# Patient Record
Sex: Female | Born: 1965 | Race: White | Hispanic: No | Smoking: Current every day smoker
Health system: Southern US, Community
[De-identification: ages and names within clinical notes are randomized; demographics above are authoritative.]

## PROBLEM LIST (undated history)

## (undated) DIAGNOSIS — C569 Malignant neoplasm of unspecified ovary: Secondary | ICD-10-CM

## (undated) DIAGNOSIS — F909 Attention-deficit hyperactivity disorder, unspecified type: Secondary | ICD-10-CM

## (undated) DIAGNOSIS — H2012 Chronic iridocyclitis, left eye: Secondary | ICD-10-CM

## (undated) DIAGNOSIS — R768 Other specified abnormal immunological findings in serum: Secondary | ICD-10-CM

## (undated) DIAGNOSIS — F411 Generalized anxiety disorder: Secondary | ICD-10-CM

## (undated) DIAGNOSIS — D071 Carcinoma in situ of vulva: Secondary | ICD-10-CM

## (undated) DIAGNOSIS — K219 Gastro-esophageal reflux disease without esophagitis: Secondary | ICD-10-CM

## (undated) DIAGNOSIS — K118 Other diseases of salivary glands: Secondary | ICD-10-CM

## (undated) DIAGNOSIS — F3289 Other specified depressive episodes: Secondary | ICD-10-CM

## (undated) DIAGNOSIS — E039 Hypothyroidism, unspecified: Secondary | ICD-10-CM

## (undated) DIAGNOSIS — F1911 Other psychoactive substance abuse, in remission: Secondary | ICD-10-CM

## (undated) DIAGNOSIS — F329 Major depressive disorder, single episode, unspecified: Secondary | ICD-10-CM

## (undated) DIAGNOSIS — I10 Essential (primary) hypertension: Secondary | ICD-10-CM

## (undated) HISTORY — DX: Morbid (severe) obesity due to excess calories: E66.01

## (undated) HISTORY — DX: Major depressive disorder, single episode, unspecified: F32.9

## (undated) HISTORY — DX: Malignant neoplasm of unspecified ovary: C56.9

## (undated) HISTORY — DX: Other specified depressive episodes: F32.89

## (undated) HISTORY — DX: Other specified abnormal immunological findings in serum: R76.8

## (undated) HISTORY — DX: Essential (primary) hypertension: I10

## (undated) HISTORY — DX: Generalized anxiety disorder: F41.1

## (undated) HISTORY — DX: Attention-deficit hyperactivity disorder, unspecified type: F90.9

## (undated) HISTORY — PX: OTHER SURGICAL HISTORY: SHX169

---

## 1983-04-07 HISTORY — PX: TONSILLECTOMY AND ADENOIDECTOMY: SUR1326

## 1983-06-05 HISTORY — PX: DILATION AND CURETTAGE OF UTERUS: SHX78

## 1985-04-06 HISTORY — PX: CERVICAL POLYPECTOMY: SHX88

## 1996-04-06 HISTORY — PX: ECTOPIC PREGNANCY SURGERY: SHX613

## 2001-01-05 ENCOUNTER — Encounter: Admission: RE | Admit: 2001-01-05 | Discharge: 2001-01-05 | Payer: Self-pay | Admitting: Surgery

## 2001-01-05 ENCOUNTER — Encounter: Payer: Self-pay | Admitting: Surgery

## 2001-02-15 ENCOUNTER — Encounter: Payer: Self-pay | Admitting: Emergency Medicine

## 2001-02-15 ENCOUNTER — Emergency Department (HOSPITAL_COMMUNITY): Admission: EM | Admit: 2001-02-15 | Discharge: 2001-02-15 | Payer: Self-pay | Admitting: Emergency Medicine

## 2003-04-07 HISTORY — PX: CHOLECYSTECTOMY: SHX55

## 2003-04-25 ENCOUNTER — Inpatient Hospital Stay (HOSPITAL_COMMUNITY): Admission: EM | Admit: 2003-04-25 | Discharge: 2003-04-27 | Payer: Self-pay | Admitting: Emergency Medicine

## 2003-04-25 ENCOUNTER — Encounter (INDEPENDENT_AMBULATORY_CARE_PROVIDER_SITE_OTHER): Payer: Self-pay | Admitting: Specialist

## 2003-12-31 ENCOUNTER — Ambulatory Visit: Payer: Self-pay | Admitting: *Deleted

## 2004-02-07 ENCOUNTER — Ambulatory Visit: Payer: Self-pay | Admitting: Internal Medicine

## 2004-03-27 ENCOUNTER — Ambulatory Visit: Payer: Self-pay | Admitting: Internal Medicine

## 2005-03-20 ENCOUNTER — Ambulatory Visit: Payer: Self-pay | Admitting: Internal Medicine

## 2005-05-05 ENCOUNTER — Ambulatory Visit: Payer: Self-pay | Admitting: Family Medicine

## 2005-05-13 ENCOUNTER — Ambulatory Visit (HOSPITAL_COMMUNITY): Admission: RE | Admit: 2005-05-13 | Discharge: 2005-05-13 | Payer: Self-pay | Admitting: Internal Medicine

## 2005-05-13 ENCOUNTER — Ambulatory Visit: Payer: Self-pay | Admitting: Internal Medicine

## 2005-06-11 ENCOUNTER — Ambulatory Visit: Payer: Self-pay | Admitting: Internal Medicine

## 2005-06-19 ENCOUNTER — Ambulatory Visit: Payer: Self-pay | Admitting: Internal Medicine

## 2005-07-10 ENCOUNTER — Ambulatory Visit: Payer: Self-pay | Admitting: Internal Medicine

## 2005-09-11 ENCOUNTER — Ambulatory Visit: Payer: Self-pay | Admitting: Internal Medicine

## 2005-09-17 ENCOUNTER — Ambulatory Visit: Payer: Self-pay | Admitting: *Deleted

## 2006-01-04 ENCOUNTER — Ambulatory Visit: Payer: Self-pay | Admitting: Internal Medicine

## 2006-01-06 ENCOUNTER — Ambulatory Visit: Payer: Self-pay | Admitting: Internal Medicine

## 2006-02-09 ENCOUNTER — Ambulatory Visit: Payer: Self-pay | Admitting: Internal Medicine

## 2006-02-10 ENCOUNTER — Ambulatory Visit (HOSPITAL_COMMUNITY): Admission: RE | Admit: 2006-02-10 | Discharge: 2006-02-10 | Payer: Self-pay | Admitting: Internal Medicine

## 2006-06-02 ENCOUNTER — Ambulatory Visit: Payer: Self-pay | Admitting: Internal Medicine

## 2006-07-12 ENCOUNTER — Ambulatory Visit: Payer: Self-pay | Admitting: Internal Medicine

## 2006-08-25 ENCOUNTER — Ambulatory Visit: Payer: Self-pay | Admitting: Internal Medicine

## 2006-09-16 ENCOUNTER — Ambulatory Visit: Payer: Self-pay | Admitting: Internal Medicine

## 2006-09-22 ENCOUNTER — Ambulatory Visit: Payer: Self-pay | Admitting: Internal Medicine

## 2006-11-11 DIAGNOSIS — E039 Hypothyroidism, unspecified: Secondary | ICD-10-CM | POA: Insufficient documentation

## 2006-11-11 DIAGNOSIS — J309 Allergic rhinitis, unspecified: Secondary | ICD-10-CM | POA: Insufficient documentation

## 2006-11-11 DIAGNOSIS — I10 Essential (primary) hypertension: Secondary | ICD-10-CM | POA: Insufficient documentation

## 2006-12-22 ENCOUNTER — Encounter (INDEPENDENT_AMBULATORY_CARE_PROVIDER_SITE_OTHER): Payer: Self-pay | Admitting: *Deleted

## 2007-04-07 HISTORY — PX: BILATERAL OOPHORECTOMY: SHX1221

## 2007-04-07 HISTORY — PX: ABDOMINAL HYSTERECTOMY: SHX81

## 2007-05-31 ENCOUNTER — Ambulatory Visit: Payer: Self-pay | Admitting: Nurse Practitioner

## 2007-05-31 ENCOUNTER — Other Ambulatory Visit: Admission: RE | Admit: 2007-05-31 | Discharge: 2007-05-31 | Payer: Self-pay | Admitting: Family Medicine

## 2007-05-31 DIAGNOSIS — B1710 Acute hepatitis C without hepatic coma: Secondary | ICD-10-CM | POA: Insufficient documentation

## 2007-05-31 DIAGNOSIS — A630 Anogenital (venereal) warts: Secondary | ICD-10-CM

## 2007-05-31 DIAGNOSIS — F329 Major depressive disorder, single episode, unspecified: Secondary | ICD-10-CM

## 2007-05-31 DIAGNOSIS — M549 Dorsalgia, unspecified: Secondary | ICD-10-CM | POA: Insufficient documentation

## 2007-05-31 DIAGNOSIS — B977 Papillomavirus as the cause of diseases classified elsewhere: Secondary | ICD-10-CM

## 2007-06-01 ENCOUNTER — Encounter (INDEPENDENT_AMBULATORY_CARE_PROVIDER_SITE_OTHER): Payer: Self-pay | Admitting: Nurse Practitioner

## 2007-06-01 LAB — CONVERTED CEMR LAB
AST: 17 units/L (ref 0–37)
Albumin: 3.7 g/dL (ref 3.5–5.2)
Alkaline Phosphatase: 160 units/L — ABNORMAL HIGH (ref 39–117)
BUN: 6 mg/dL (ref 6–23)
CO2: 27 meq/L (ref 19–32)
Creatinine, Ser: 0.76 mg/dL (ref 0.40–1.20)
Eosinophils Absolute: 0.1 10*3/uL (ref 0.0–0.7)
Eosinophils Relative: 1 % (ref 0–5)
Glucose, Bld: 111 mg/dL — ABNORMAL HIGH (ref 70–99)
HCT: 41.9 % (ref 36.0–46.0)
HDL: 20 mg/dL — ABNORMAL LOW (ref >39)
LDL Cholesterol: 96 mg/dL (ref 0–99)
Lymphocytes Relative: 16 % (ref 12–46)
Lymphs Abs: 2 10*3/uL (ref 0.7–4.0)
MCV: 92.5 fL (ref 78.0–100.0)
Monocytes Relative: 7 % (ref 3–12)
Sodium: 139 meq/L (ref 135–145)
TSH: 0.014 microintl units/mL — ABNORMAL LOW (ref 0.350–5.50)
Total CHOL/HDL Ratio: 7.4
WBC: 12.6 10*3/uL — ABNORMAL HIGH (ref 4.0–10.5)

## 2007-06-02 ENCOUNTER — Telehealth (INDEPENDENT_AMBULATORY_CARE_PROVIDER_SITE_OTHER): Payer: Self-pay | Admitting: Nurse Practitioner

## 2007-06-15 ENCOUNTER — Ambulatory Visit: Payer: Self-pay | Admitting: Nurse Practitioner

## 2007-06-16 ENCOUNTER — Encounter (INDEPENDENT_AMBULATORY_CARE_PROVIDER_SITE_OTHER): Payer: Self-pay | Admitting: Nurse Practitioner

## 2007-06-16 LAB — CONVERTED CEMR LAB
HCV Quantitative: 91200 intl units/mL — ABNORMAL HIGH (ref <43)
Hep A Total Ab: NEGATIVE
Hep B Core Total Ab: NEGATIVE
Hep B S Ab: NEGATIVE
Progesterone: 2.5 ng/mL
Prolactin: 16.4 ng/mL
Testosterone: 196.57 ng/dL — ABNORMAL HIGH (ref 10–70)

## 2007-06-28 ENCOUNTER — Ambulatory Visit (HOSPITAL_COMMUNITY): Admission: RE | Admit: 2007-06-28 | Discharge: 2007-06-28 | Payer: Self-pay | Admitting: Internal Medicine

## 2007-06-29 ENCOUNTER — Ambulatory Visit: Payer: Self-pay | Admitting: Nurse Practitioner

## 2007-06-29 LAB — CONVERTED CEMR LAB: Blood Glucose, Fingerstick: 123

## 2007-07-01 ENCOUNTER — Ambulatory Visit (HOSPITAL_COMMUNITY): Admission: RE | Admit: 2007-07-01 | Discharge: 2007-07-01 | Payer: Self-pay | Admitting: Family Medicine

## 2007-07-01 DIAGNOSIS — K439 Ventral hernia without obstruction or gangrene: Secondary | ICD-10-CM | POA: Insufficient documentation

## 2007-07-04 ENCOUNTER — Telehealth (INDEPENDENT_AMBULATORY_CARE_PROVIDER_SITE_OTHER): Payer: Self-pay | Admitting: Nurse Practitioner

## 2007-07-08 ENCOUNTER — Ambulatory Visit: Payer: Self-pay | Admitting: Nurse Practitioner

## 2007-07-12 ENCOUNTER — Telehealth (INDEPENDENT_AMBULATORY_CARE_PROVIDER_SITE_OTHER): Payer: Self-pay | Admitting: Nurse Practitioner

## 2007-07-12 LAB — CONVERTED CEMR LAB: Free T4: 1.7 ng/dL (ref 0.89–1.80)

## 2007-07-14 ENCOUNTER — Emergency Department (HOSPITAL_COMMUNITY): Admission: EM | Admit: 2007-07-14 | Discharge: 2007-07-14 | Payer: Self-pay | Admitting: Emergency Medicine

## 2007-07-18 ENCOUNTER — Inpatient Hospital Stay (HOSPITAL_COMMUNITY): Admission: AD | Admit: 2007-07-18 | Discharge: 2007-07-18 | Payer: Self-pay | Admitting: Obstetrics & Gynecology

## 2007-07-18 ENCOUNTER — Telehealth (INDEPENDENT_AMBULATORY_CARE_PROVIDER_SITE_OTHER): Payer: Self-pay | Admitting: Nurse Practitioner

## 2007-07-20 ENCOUNTER — Ambulatory Visit: Payer: Self-pay | Admitting: Gynecology

## 2007-07-20 ENCOUNTER — Other Ambulatory Visit: Admission: RE | Admit: 2007-07-20 | Discharge: 2007-07-20 | Payer: Self-pay | Admitting: Obstetrics & Gynecology

## 2007-08-10 ENCOUNTER — Encounter (INDEPENDENT_AMBULATORY_CARE_PROVIDER_SITE_OTHER): Payer: Self-pay | Admitting: Gynecology

## 2007-08-10 ENCOUNTER — Ambulatory Visit: Payer: Self-pay | Admitting: Gynecology

## 2007-08-10 ENCOUNTER — Inpatient Hospital Stay (HOSPITAL_COMMUNITY): Admission: RE | Admit: 2007-08-10 | Discharge: 2007-08-14 | Payer: Self-pay | Admitting: Gynecology

## 2007-08-10 DIAGNOSIS — C569 Malignant neoplasm of unspecified ovary: Secondary | ICD-10-CM | POA: Insufficient documentation

## 2007-08-16 ENCOUNTER — Ambulatory Visit: Payer: Self-pay | Admitting: Oncology

## 2007-08-18 ENCOUNTER — Ambulatory Visit: Payer: Self-pay | Admitting: Obstetrics & Gynecology

## 2007-08-25 ENCOUNTER — Ambulatory Visit: Payer: Self-pay | Admitting: Gynecology

## 2007-08-30 ENCOUNTER — Encounter (INDEPENDENT_AMBULATORY_CARE_PROVIDER_SITE_OTHER): Payer: Self-pay | Admitting: Nurse Practitioner

## 2007-08-30 ENCOUNTER — Ambulatory Visit: Admission: RE | Admit: 2007-08-30 | Discharge: 2007-08-30 | Payer: Self-pay | Admitting: Gynecologic Oncology

## 2007-08-30 ENCOUNTER — Telehealth (INDEPENDENT_AMBULATORY_CARE_PROVIDER_SITE_OTHER): Payer: Self-pay | Admitting: Family Medicine

## 2007-09-02 ENCOUNTER — Ambulatory Visit: Payer: Self-pay | Admitting: Obstetrics and Gynecology

## 2007-09-02 ENCOUNTER — Inpatient Hospital Stay (HOSPITAL_COMMUNITY): Admission: AD | Admit: 2007-09-02 | Discharge: 2007-09-08 | Payer: Self-pay | Admitting: Obstetrics & Gynecology

## 2007-09-08 ENCOUNTER — Telehealth (INDEPENDENT_AMBULATORY_CARE_PROVIDER_SITE_OTHER): Payer: Self-pay | Admitting: Nurse Practitioner

## 2007-09-08 ENCOUNTER — Encounter (INDEPENDENT_AMBULATORY_CARE_PROVIDER_SITE_OTHER): Payer: Self-pay | Admitting: Nurse Practitioner

## 2007-09-09 ENCOUNTER — Ambulatory Visit: Payer: Self-pay | Admitting: *Deleted

## 2007-09-09 ENCOUNTER — Encounter (INDEPENDENT_AMBULATORY_CARE_PROVIDER_SITE_OTHER): Payer: Self-pay | Admitting: Surgery

## 2007-09-09 ENCOUNTER — Inpatient Hospital Stay (HOSPITAL_COMMUNITY): Admission: AD | Admit: 2007-09-09 | Discharge: 2007-09-28 | Payer: Self-pay | Admitting: Gynecology

## 2007-09-21 ENCOUNTER — Encounter (INDEPENDENT_AMBULATORY_CARE_PROVIDER_SITE_OTHER): Payer: Self-pay | Admitting: Surgery

## 2007-09-21 ENCOUNTER — Other Ambulatory Visit (INDEPENDENT_AMBULATORY_CARE_PROVIDER_SITE_OTHER): Payer: Self-pay | Admitting: Surgery

## 2007-09-29 ENCOUNTER — Telehealth (INDEPENDENT_AMBULATORY_CARE_PROVIDER_SITE_OTHER): Payer: Self-pay | Admitting: Nurse Practitioner

## 2007-10-03 ENCOUNTER — Encounter (INDEPENDENT_AMBULATORY_CARE_PROVIDER_SITE_OTHER): Payer: Self-pay | Admitting: Nurse Practitioner

## 2007-10-04 ENCOUNTER — Ambulatory Visit: Payer: Self-pay | Admitting: Oncology

## 2007-10-04 ENCOUNTER — Encounter (INDEPENDENT_AMBULATORY_CARE_PROVIDER_SITE_OTHER): Payer: Self-pay | Admitting: Nurse Practitioner

## 2007-10-04 LAB — CBC WITH DIFFERENTIAL/PLATELET
Basophils Absolute: 0.1 10*3/uL (ref 0.0–0.1)
EOS%: 3.4 % (ref 0.0–7.0)
Eosinophils Absolute: 0.4 10*3/uL (ref 0.0–0.5)
HCT: 35.6 % (ref 34.8–46.6)
MCH: 26 pg (ref 26.0–34.0)
MCHC: 32.9 g/dL (ref 32.0–36.0)
MCV: 79.3 fL — ABNORMAL LOW (ref 81.0–101.0)
MONO#: 1 10*3/uL — ABNORMAL HIGH (ref 0.1–0.9)
Platelets: 683 10*3/uL — ABNORMAL HIGH (ref 145–400)
RBC: 4.49 10*6/uL (ref 3.70–5.32)
RDW: 18.4 % — ABNORMAL HIGH (ref 11.3–14.5)
lymph#: 2 10*3/uL (ref 0.9–3.3)

## 2007-10-04 LAB — COMPREHENSIVE METABOLIC PANEL
AST: 35 U/L (ref 0–37)
Albumin: 4.4 g/dL (ref 3.5–5.2)
BUN: 15 mg/dL (ref 6–23)
Calcium: 10.2 mg/dL (ref 8.4–10.5)
Chloride: 98 mEq/L (ref 96–112)
Glucose, Bld: 114 mg/dL — ABNORMAL HIGH (ref 70–99)
Potassium: 4.1 mEq/L (ref 3.5–5.3)

## 2007-10-24 ENCOUNTER — Inpatient Hospital Stay (HOSPITAL_COMMUNITY): Admission: EM | Admit: 2007-10-24 | Discharge: 2007-10-31 | Payer: Self-pay | Admitting: Emergency Medicine

## 2007-10-25 ENCOUNTER — Encounter (INDEPENDENT_AMBULATORY_CARE_PROVIDER_SITE_OTHER): Payer: Self-pay | Admitting: General Surgery

## 2007-11-22 ENCOUNTER — Telehealth (INDEPENDENT_AMBULATORY_CARE_PROVIDER_SITE_OTHER): Payer: Self-pay | Admitting: Family Medicine

## 2007-11-23 ENCOUNTER — Encounter (INDEPENDENT_AMBULATORY_CARE_PROVIDER_SITE_OTHER): Payer: Self-pay | Admitting: Nurse Practitioner

## 2007-11-23 ENCOUNTER — Ambulatory Visit: Admission: RE | Admit: 2007-11-23 | Discharge: 2007-11-23 | Payer: Self-pay | Admitting: Gynecologic Oncology

## 2007-11-28 ENCOUNTER — Ambulatory Visit: Payer: Self-pay | Admitting: Oncology

## 2007-11-29 LAB — CBC WITH DIFFERENTIAL/PLATELET
Basophils Absolute: 0 10*3/uL (ref 0.0–0.1)
Eosinophils Absolute: 0.3 10*3/uL (ref 0.0–0.5)
HCT: 41.3 % (ref 34.8–46.6)
HGB: 13.3 g/dL (ref 11.6–15.9)
LYMPH%: 27 % (ref 14.0–48.0)
MCV: 77.3 fL — ABNORMAL LOW (ref 81.0–101.0)
MONO#: 0.9 10*3/uL (ref 0.1–0.9)
MONO%: 8 % (ref 0.0–13.0)
NEUT#: 7.4 10*3/uL — ABNORMAL HIGH (ref 1.5–6.5)
Platelets: 487 10*3/uL — ABNORMAL HIGH (ref 145–400)
RBC: 5.34 10*6/uL — ABNORMAL HIGH (ref 3.70–5.32)
WBC: 11.9 10*3/uL — ABNORMAL HIGH (ref 3.9–10.0)

## 2007-11-29 LAB — COMPREHENSIVE METABOLIC PANEL
ALT: 66 U/L — ABNORMAL HIGH (ref 0–35)
AST: 60 U/L — ABNORMAL HIGH (ref 0–37)
Albumin: 4.3 g/dL (ref 3.5–5.2)
Alkaline Phosphatase: 256 U/L — ABNORMAL HIGH (ref 39–117)
Glucose, Bld: 108 mg/dL — ABNORMAL HIGH (ref 70–99)
Potassium: 4.3 mEq/L (ref 3.5–5.3)
Sodium: 140 mEq/L (ref 135–145)
Total Bilirubin: 0.5 mg/dL (ref 0.3–1.2)
Total Protein: 8 g/dL (ref 6.0–8.3)

## 2007-12-15 ENCOUNTER — Encounter (INDEPENDENT_AMBULATORY_CARE_PROVIDER_SITE_OTHER): Payer: Self-pay | Admitting: Nurse Practitioner

## 2007-12-15 LAB — COMPREHENSIVE METABOLIC PANEL
AST: 55 U/L — ABNORMAL HIGH (ref 0–37)
Albumin: 3.7 g/dL (ref 3.5–5.2)
BUN: 18 mg/dL (ref 6–23)
Calcium: 9.2 mg/dL (ref 8.4–10.5)
Chloride: 102 mEq/L (ref 96–112)
Creatinine, Ser: 1 mg/dL (ref 0.40–1.20)
Glucose, Bld: 114 mg/dL — ABNORMAL HIGH (ref 70–99)
Potassium: 4.5 mEq/L (ref 3.5–5.3)

## 2007-12-15 LAB — CBC WITH DIFFERENTIAL/PLATELET
BASO%: 0.5 % (ref 0.0–2.0)
Eosinophils Absolute: 0.2 10*3/uL (ref 0.0–0.5)
MCHC: 32.6 g/dL (ref 32.0–36.0)
MONO#: 0.7 10*3/uL (ref 0.1–0.9)
NEUT#: 6 10*3/uL (ref 1.5–6.5)
RBC: 5.06 10*6/uL (ref 3.70–5.32)
RDW: 16.4 % — ABNORMAL HIGH (ref 11.3–14.5)
WBC: 9.5 10*3/uL (ref 3.9–10.0)

## 2007-12-16 LAB — WHOLE BLOOD GLUCOSE
Glucose: 331 mg/dL — ABNORMAL HIGH (ref 70–100)
Glucose: 235 mg/dL — ABNORMAL HIGH (ref 70–100)
HRS PC: 1.5 Hours
HRS PC: 0 Hours

## 2007-12-22 ENCOUNTER — Encounter (INDEPENDENT_AMBULATORY_CARE_PROVIDER_SITE_OTHER): Payer: Self-pay | Admitting: Nurse Practitioner

## 2007-12-22 LAB — CBC WITH DIFFERENTIAL/PLATELET
BASO%: 1.1 % (ref 0.0–2.0)
Basophils Absolute: 0.1 10*3/uL (ref 0.0–0.1)
EOS%: 2.4 % (ref 0.0–7.0)
HGB: 12.9 g/dL (ref 11.6–15.9)
MCH: 24.2 pg — ABNORMAL LOW (ref 26.0–34.0)
MCHC: 31.9 g/dL — ABNORMAL LOW (ref 32.0–36.0)
MCV: 75.9 fL — ABNORMAL LOW (ref 81.0–101.0)
MONO%: 4.6 % (ref 0.0–13.0)
RBC: 5.33 10*6/uL — ABNORMAL HIGH (ref 3.70–5.32)
RDW: 15.5 % — ABNORMAL HIGH (ref 11.3–14.5)
lymph#: 2.3 10*3/uL (ref 0.9–3.3)

## 2007-12-29 LAB — CBC WITH DIFFERENTIAL/PLATELET
BASO%: 0.8 % (ref 0.0–2.0)
Basophils Absolute: 0.1 10*3/uL (ref 0.0–0.1)
HCT: 38.8 % (ref 34.8–46.6)
HGB: 12.5 g/dL (ref 11.6–15.9)
LYMPH%: 27.6 % (ref 14.0–48.0)
MCH: 24.1 pg — ABNORMAL LOW (ref 26.0–34.0)
MCHC: 32.1 g/dL (ref 32.0–36.0)
MONO#: 0.9 10*3/uL (ref 0.1–0.9)
NEUT%: 63.6 % (ref 39.6–76.8)
Platelets: 273 10*3/uL (ref 145–400)
WBC: 12 10*3/uL — ABNORMAL HIGH (ref 3.9–10.0)

## 2008-01-02 ENCOUNTER — Encounter (INDEPENDENT_AMBULATORY_CARE_PROVIDER_SITE_OTHER): Payer: Self-pay | Admitting: Nurse Practitioner

## 2008-01-04 LAB — COMPREHENSIVE METABOLIC PANEL
ALT: 44 U/L — ABNORMAL HIGH (ref 0–35)
AST: 40 U/L — ABNORMAL HIGH (ref 0–37)
Alkaline Phosphatase: 188 U/L — ABNORMAL HIGH (ref 39–117)
BUN: 19 mg/dL (ref 6–23)
Chloride: 103 mEq/L (ref 96–112)
Creatinine, Ser: 0.84 mg/dL (ref 0.40–1.20)
Total Bilirubin: 0.7 mg/dL (ref 0.3–1.2)

## 2008-01-04 LAB — CBC WITH DIFFERENTIAL/PLATELET
BASO%: 0.2 % (ref 0.0–2.0)
Basophils Absolute: 0 10*3/uL (ref 0.0–0.1)
EOS%: 0.8 % (ref 0.0–7.0)
HCT: 39 % (ref 34.8–46.6)
LYMPH%: 21.8 % (ref 14.0–48.0)
MCH: 24.9 pg — ABNORMAL LOW (ref 26.0–34.0)
MCHC: 32.4 g/dL (ref 32.0–36.0)
MCV: 76.8 fL — ABNORMAL LOW (ref 81.0–101.0)
MONO%: 7.3 % (ref 0.0–13.0)
NEUT%: 69.9 % (ref 39.6–76.8)
lymph#: 2.3 10*3/uL (ref 0.9–3.3)

## 2008-01-16 ENCOUNTER — Ambulatory Visit: Payer: Self-pay | Admitting: Oncology

## 2008-01-16 LAB — CBC WITH DIFFERENTIAL/PLATELET
Eosinophils Absolute: 0.1 10*3/uL (ref 0.0–0.5)
HCT: 39.7 % (ref 34.8–46.6)
LYMPH%: 22.5 % (ref 14.0–48.0)
MONO#: 0.9 10*3/uL (ref 0.1–0.9)
NEUT#: 7.7 10*3/uL — ABNORMAL HIGH (ref 1.5–6.5)
NEUT%: 67.7 % (ref 39.6–76.8)
Platelets: 271 10*3/uL (ref 145–400)
RBC: 5.17 10*6/uL (ref 3.70–5.32)
WBC: 11.3 10*3/uL — ABNORMAL HIGH (ref 3.9–10.0)

## 2008-02-02 LAB — COMPREHENSIVE METABOLIC PANEL
BUN: 14 mg/dL (ref 6–23)
CO2: 24 mEq/L (ref 19–32)
Calcium: 9.6 mg/dL (ref 8.4–10.5)
Creatinine, Ser: 0.88 mg/dL (ref 0.40–1.20)
Glucose, Bld: 113 mg/dL — ABNORMAL HIGH (ref 70–99)
Total Bilirubin: 1 mg/dL (ref 0.3–1.2)

## 2008-02-02 LAB — CA 125: CA 125: 11.4 U/mL (ref 0.0–30.2)

## 2008-02-02 LAB — CBC WITH DIFFERENTIAL/PLATELET
BASO%: 0.4 % (ref 0.0–2.0)
Basophils Absolute: 0 10*3/uL (ref 0.0–0.1)
Eosinophils Absolute: 0.2 10*3/uL (ref 0.0–0.5)
HCT: 41 % (ref 34.8–46.6)
HGB: 13.5 g/dL (ref 11.6–15.9)
LYMPH%: 23.7 % (ref 14.0–48.0)
MCHC: 33 g/dL (ref 32.0–36.0)
MONO#: 0.7 10*3/uL (ref 0.1–0.9)
NEUT%: 64.4 % (ref 39.6–76.8)
Platelets: 406 10*3/uL — ABNORMAL HIGH (ref 145–400)
WBC: 8.4 10*3/uL (ref 3.9–10.0)

## 2008-02-03 LAB — WHOLE BLOOD GLUCOSE
Glucose: 168 mg/dL — ABNORMAL HIGH (ref 70–100)
HRS PC: 8.5 Hours

## 2008-02-14 LAB — CBC WITH DIFFERENTIAL/PLATELET
Basophils Absolute: 0 10*3/uL (ref 0.0–0.1)
Eosinophils Absolute: 0.1 10*3/uL (ref 0.0–0.5)
HCT: 41 % (ref 34.8–46.6)
HGB: 13.5 g/dL (ref 11.6–15.9)
MONO#: 0.9 10*3/uL (ref 0.1–0.9)
NEUT%: 71.7 % (ref 39.6–76.8)
WBC: 12.4 10*3/uL — ABNORMAL HIGH (ref 3.9–10.0)
lymph#: 2.5 10*3/uL (ref 0.9–3.3)

## 2008-02-14 LAB — COMPREHENSIVE METABOLIC PANEL
ALT: 32 U/L (ref 0–35)
BUN: 8 mg/dL (ref 6–23)
CO2: 21 mEq/L (ref 19–32)
Chloride: 108 mEq/L (ref 96–112)
Creatinine, Ser: 0.81 mg/dL (ref 0.40–1.20)
Glucose, Bld: 95 mg/dL (ref 70–99)

## 2008-02-16 LAB — CLOSTRIDIUM DIFFICILE EIA

## 2008-02-24 LAB — CBC WITH DIFFERENTIAL/PLATELET
BASO%: 0.4 % (ref 0.0–2.0)
MCHC: 32.6 g/dL (ref 32.0–36.0)
MONO#: 0.1 10*3/uL (ref 0.1–0.9)
RBC: 5.17 10*6/uL (ref 3.70–5.32)
WBC: 9.6 10*3/uL (ref 3.9–10.0)
lymph#: 0.8 10*3/uL — ABNORMAL LOW (ref 0.9–3.3)

## 2008-02-24 LAB — WHOLE BLOOD GLUCOSE
Glucose: 253 mg/dL — ABNORMAL HIGH (ref 70–100)
Glucose: 183 mg/dL — ABNORMAL HIGH (ref 70–100)
HRS PC: 1 Hours

## 2008-03-05 ENCOUNTER — Telehealth (INDEPENDENT_AMBULATORY_CARE_PROVIDER_SITE_OTHER): Payer: Self-pay | Admitting: Nurse Practitioner

## 2008-03-08 ENCOUNTER — Ambulatory Visit: Payer: Self-pay | Admitting: Oncology

## 2008-03-12 ENCOUNTER — Encounter: Payer: Self-pay | Admitting: Endocrinology

## 2008-03-12 LAB — CBC WITH DIFFERENTIAL/PLATELET
Basophils Absolute: 0 10*3/uL (ref 0.0–0.1)
EOS%: 0.5 % (ref 0.0–7.0)
HCT: 41.3 % (ref 34.8–46.6)
HGB: 13.8 g/dL (ref 11.6–15.9)
MCH: 28.4 pg (ref 26.0–34.0)
MCV: 85.4 fL (ref 81.0–101.0)
NEUT%: 69.2 % (ref 39.6–76.8)
Platelets: 212 10*3/uL (ref 145–400)
lymph#: 1.9 10*3/uL (ref 0.9–3.3)

## 2008-03-12 LAB — CA 125: CA 125: 11.3 U/mL (ref 0.0–30.2)

## 2008-03-12 LAB — COMPREHENSIVE METABOLIC PANEL
AST: 32 U/L (ref 0–37)
BUN: 12 mg/dL (ref 6–23)
Calcium: 9.2 mg/dL (ref 8.4–10.5)
Chloride: 103 mEq/L (ref 96–112)
Creatinine, Ser: 0.87 mg/dL (ref 0.40–1.20)

## 2008-03-13 ENCOUNTER — Telehealth (INDEPENDENT_AMBULATORY_CARE_PROVIDER_SITE_OTHER): Payer: Self-pay | Admitting: Nurse Practitioner

## 2008-03-13 ENCOUNTER — Ambulatory Visit: Payer: Self-pay | Admitting: Nurse Practitioner

## 2008-03-13 LAB — CONVERTED CEMR LAB
Alkaline Phosphatase: 156 units/L — ABNORMAL HIGH (ref 39–117)
BUN: 15 mg/dL (ref 6–23)
CO2: 25 meq/L (ref 19–32)
Eosinophils Absolute: 0.1 10*3/uL (ref 0.0–0.7)
Eosinophils Relative: 1 % (ref 0–5)
Glucose, Bld: 130 mg/dL — ABNORMAL HIGH (ref 70–99)
HCT: 43.5 % (ref 36.0–46.0)
Lymphocytes Relative: 28 % (ref 12–46)
Lymphs Abs: 2.2 10*3/uL (ref 0.7–4.0)
Monocytes Absolute: 0.9 10*3/uL (ref 0.1–1.0)
Monocytes Relative: 12 % (ref 3–12)
RBC: 4.8 M/uL (ref 3.87–5.11)
Sodium: 143 meq/L (ref 135–145)
Total Bilirubin: 0.5 mg/dL (ref 0.3–1.2)
Total Protein: 6.4 g/dL (ref 6.0–8.3)
WBC: 7.7 10*3/uL (ref 4.0–10.5)

## 2008-03-16 LAB — WHOLE BLOOD GLUCOSE: HRS PC: 1.5 Hours

## 2008-03-20 ENCOUNTER — Ambulatory Visit: Payer: Self-pay | Admitting: Endocrinology

## 2008-03-20 LAB — CONVERTED CEMR LAB
Folate: >20 ng/mL
Hgb A1c MFr Bld: 6.4 % — ABNORMAL HIGH (ref 4.6–6.0)

## 2008-04-04 ENCOUNTER — Encounter: Payer: Self-pay | Admitting: Endocrinology

## 2008-04-04 LAB — COMPREHENSIVE METABOLIC PANEL
Alkaline Phosphatase: 191 U/L — ABNORMAL HIGH (ref 39–117)
CO2: 26 mEq/L (ref 19–32)
Creatinine, Ser: 0.91 mg/dL (ref 0.40–1.20)
Glucose, Bld: 96 mg/dL (ref 70–99)
Total Bilirubin: 0.5 mg/dL (ref 0.3–1.2)

## 2008-04-04 LAB — CBC WITH DIFFERENTIAL/PLATELET
BASO%: 0.3 % (ref 0.0–2.0)
Eosinophils Absolute: 0.1 10*3/uL (ref 0.0–0.5)
HCT: 41.5 % (ref 34.8–46.6)
LYMPH%: 26.7 % (ref 14.0–48.0)
MCHC: 33.5 g/dL (ref 32.0–36.0)
MCV: 89.6 fL (ref 81.0–101.0)
MONO#: 0.6 10*3/uL (ref 0.1–0.9)
MONO%: 8.1 % (ref 0.0–13.0)
NEUT%: 64.2 % (ref 39.6–76.8)
Platelets: 260 10*3/uL (ref 145–400)
WBC: 7.9 10*3/uL (ref 3.9–10.0)

## 2008-04-04 LAB — CA 125: CA 125: 13 U/mL (ref 0.0–30.2)

## 2008-04-13 LAB — CBC WITH DIFFERENTIAL/PLATELET
BASO%: 0.6 % (ref 0.0–2.0)
Basophils Absolute: 0 10*3/uL (ref 0.0–0.1)
Eosinophils Absolute: 0 10*3/uL (ref 0.0–0.5)
HCT: 36.8 % (ref 34.8–46.6)
HGB: 12.6 g/dL (ref 11.6–15.9)
MONO#: 0.3 10*3/uL (ref 0.1–0.9)
NEUT%: 48.9 % (ref 39.6–76.8)
WBC: 4.5 10*3/uL (ref 3.9–10.0)
lymph#: 1.9 10*3/uL (ref 0.9–3.3)

## 2008-04-23 ENCOUNTER — Ambulatory Visit (HOSPITAL_COMMUNITY): Admission: RE | Admit: 2008-04-23 | Discharge: 2008-04-23 | Payer: Self-pay | Admitting: Oncology

## 2008-04-25 ENCOUNTER — Ambulatory Visit: Admission: RE | Admit: 2008-04-25 | Discharge: 2008-04-25 | Payer: Self-pay | Admitting: Gynecologic Oncology

## 2008-05-09 ENCOUNTER — Telehealth (INDEPENDENT_AMBULATORY_CARE_PROVIDER_SITE_OTHER): Payer: Self-pay | Admitting: *Deleted

## 2008-05-23 ENCOUNTER — Ambulatory Visit: Payer: Self-pay | Admitting: Oncology

## 2008-05-25 ENCOUNTER — Encounter: Payer: Self-pay | Admitting: Endocrinology

## 2008-05-25 LAB — CBC WITH DIFFERENTIAL/PLATELET
Basophils Absolute: 0 10*3/uL (ref 0.0–0.1)
EOS%: 2.9 % (ref 0.0–7.0)
HGB: 15.1 g/dL (ref 11.6–15.9)
LYMPH%: 31.9 % (ref 14.0–49.7)
MCH: 31.2 pg (ref 25.1–34.0)
MCV: 92.3 fL (ref 79.5–101.0)
MONO%: 8.1 % (ref 0.0–14.0)
RBC: 4.83 10*6/uL (ref 3.70–5.45)
RDW: 17.4 % — ABNORMAL HIGH (ref 11.2–14.5)

## 2008-05-25 LAB — COMPREHENSIVE METABOLIC PANEL
ALT: 61 U/L — ABNORMAL HIGH (ref 0–35)
AST: 65 U/L — ABNORMAL HIGH (ref 0–37)
Albumin: 4 g/dL (ref 3.5–5.2)
Calcium: 9.5 mg/dL (ref 8.4–10.5)
Chloride: 104 mEq/L (ref 96–112)
Potassium: 4.2 mEq/L (ref 3.5–5.3)
Sodium: 139 mEq/L (ref 135–145)

## 2008-06-18 ENCOUNTER — Ambulatory Visit: Payer: Self-pay | Admitting: Endocrinology

## 2008-06-18 DIAGNOSIS — E119 Type 2 diabetes mellitus without complications: Secondary | ICD-10-CM | POA: Insufficient documentation

## 2008-06-18 LAB — CONVERTED CEMR LAB
PTH: 17.7 pg/mL (ref 14.0–72.0)
Vitamin B-12: 435 pg/mL (ref 211–911)

## 2008-06-21 ENCOUNTER — Telehealth (INDEPENDENT_AMBULATORY_CARE_PROVIDER_SITE_OTHER): Payer: Self-pay | Admitting: *Deleted

## 2008-06-29 ENCOUNTER — Ambulatory Visit (HOSPITAL_COMMUNITY): Admission: RE | Admit: 2008-06-29 | Discharge: 2008-06-29 | Payer: Self-pay | Admitting: Oncology

## 2008-07-25 ENCOUNTER — Ambulatory Visit: Payer: Self-pay | Admitting: Oncology

## 2008-07-27 ENCOUNTER — Encounter: Payer: Self-pay | Admitting: Endocrinology

## 2008-07-27 LAB — CBC WITH DIFFERENTIAL/PLATELET
Basophils Absolute: 0 10*3/uL (ref 0.0–0.1)
EOS%: 2.2 % (ref 0.0–7.0)
HCT: 44.4 % (ref 34.8–46.6)
HGB: 15 g/dL (ref 11.6–15.9)
MCH: 30.8 pg (ref 25.1–34.0)
MONO#: 0.6 10*3/uL (ref 0.1–0.9)
NEUT%: 63.1 % (ref 38.4–76.8)
lymph#: 2.2 10*3/uL (ref 0.9–3.3)

## 2008-07-27 LAB — COMPREHENSIVE METABOLIC PANEL
BUN: 14 mg/dL (ref 6–23)
CO2: 25 mEq/L (ref 19–32)
Calcium: 9.1 mg/dL (ref 8.4–10.5)
Chloride: 103 mEq/L (ref 96–112)
Creatinine, Ser: 0.91 mg/dL (ref 0.40–1.20)

## 2008-07-27 LAB — CA 125: CA 125: 8.9 U/mL (ref 0.0–30.2)

## 2008-09-06 ENCOUNTER — Inpatient Hospital Stay (HOSPITAL_COMMUNITY): Admission: AD | Admit: 2008-09-06 | Discharge: 2008-09-09 | Payer: Self-pay | Admitting: Surgery

## 2008-09-11 ENCOUNTER — Encounter: Payer: Self-pay | Admitting: Endocrinology

## 2008-10-05 ENCOUNTER — Telehealth (INDEPENDENT_AMBULATORY_CARE_PROVIDER_SITE_OTHER): Payer: Self-pay | Admitting: *Deleted

## 2008-10-11 ENCOUNTER — Ambulatory Visit: Payer: Self-pay | Admitting: Oncology

## 2008-10-16 ENCOUNTER — Ambulatory Visit (HOSPITAL_COMMUNITY): Admission: RE | Admit: 2008-10-16 | Discharge: 2008-10-16 | Payer: Self-pay | Admitting: Oncology

## 2008-10-16 ENCOUNTER — Ambulatory Visit: Payer: Self-pay | Admitting: Endocrinology

## 2008-10-16 LAB — CONVERTED CEMR LAB: Hgb A1c MFr Bld: 6.3 % (ref 4.6–6.5)

## 2008-10-19 ENCOUNTER — Telehealth: Payer: Self-pay | Admitting: Endocrinology

## 2008-10-25 ENCOUNTER — Telehealth (INDEPENDENT_AMBULATORY_CARE_PROVIDER_SITE_OTHER): Payer: Self-pay | Admitting: *Deleted

## 2008-10-31 ENCOUNTER — Ambulatory Visit: Admission: RE | Admit: 2008-10-31 | Discharge: 2008-10-31 | Payer: Self-pay | Admitting: Gynecologic Oncology

## 2009-01-11 ENCOUNTER — Ambulatory Visit: Payer: Self-pay | Admitting: Oncology

## 2009-01-15 ENCOUNTER — Encounter: Payer: Self-pay | Admitting: Endocrinology

## 2009-01-15 LAB — COMPREHENSIVE METABOLIC PANEL
Albumin: 4.2 g/dL (ref 3.5–5.2)
Alkaline Phosphatase: 191 U/L — ABNORMAL HIGH (ref 39–117)
BUN: 11 mg/dL (ref 6–23)
CO2: 24 mEq/L (ref 19–32)
Calcium: 9.7 mg/dL (ref 8.4–10.5)
Chloride: 98 mEq/L (ref 96–112)
Glucose, Bld: 148 mg/dL — ABNORMAL HIGH (ref 70–99)
Potassium: 4.2 mEq/L (ref 3.5–5.3)
Sodium: 138 mEq/L (ref 135–145)
Total Protein: 7.5 g/dL (ref 6.0–8.3)

## 2009-01-15 LAB — CBC WITH DIFFERENTIAL/PLATELET
BASO%: 0.4 % (ref 0.0–2.0)
Basophils Absolute: 0 10*3/uL (ref 0.0–0.1)
Eosinophils Absolute: 0.2 10*3/uL (ref 0.0–0.5)
HCT: 51.6 % — ABNORMAL HIGH (ref 34.8–46.6)
HGB: 17.4 g/dL — ABNORMAL HIGH (ref 11.6–15.9)
LYMPH%: 22.9 % (ref 14.0–49.7)
MCHC: 33.7 g/dL (ref 31.5–36.0)
MONO#: 0.7 10*3/uL (ref 0.1–0.9)
NEUT#: 7.6 10*3/uL — ABNORMAL HIGH (ref 1.5–6.5)
NEUT%: 69.4 % (ref 38.4–76.8)
Platelets: 247 10*3/uL (ref 145–400)
WBC: 11 10*3/uL — ABNORMAL HIGH (ref 3.9–10.3)
lymph#: 2.5 10*3/uL (ref 0.9–3.3)

## 2009-02-05 ENCOUNTER — Ambulatory Visit: Payer: Self-pay | Admitting: Internal Medicine

## 2009-02-05 DIAGNOSIS — F17200 Nicotine dependence, unspecified, uncomplicated: Secondary | ICD-10-CM

## 2009-02-05 DIAGNOSIS — M25569 Pain in unspecified knee: Secondary | ICD-10-CM | POA: Insufficient documentation

## 2009-02-05 DIAGNOSIS — G894 Chronic pain syndrome: Secondary | ICD-10-CM | POA: Insufficient documentation

## 2009-02-06 LAB — CONVERTED CEMR LAB
CO2: 26 meq/L (ref 19–32)
Calcium: 9.2 mg/dL (ref 8.4–10.5)
Creatinine, Ser: 0.9 mg/dL (ref 0.4–1.2)
GFR calc non Af Amer: 71.96 mL/min (ref >60)
Sodium: 139 meq/L (ref 135–145)
TSH: 0.47 microintl units/mL (ref 0.35–5.50)

## 2009-02-07 ENCOUNTER — Encounter: Payer: Self-pay | Admitting: Internal Medicine

## 2009-03-07 ENCOUNTER — Encounter: Payer: Self-pay | Admitting: Internal Medicine

## 2009-04-10 ENCOUNTER — Ambulatory Visit: Payer: Self-pay | Admitting: Oncology

## 2009-04-12 LAB — CA 125: CA 125: 11.3 U/mL (ref 0.0–30.2)

## 2009-04-18 ENCOUNTER — Encounter: Payer: Self-pay | Admitting: Endocrinology

## 2009-04-18 ENCOUNTER — Other Ambulatory Visit: Admission: RE | Admit: 2009-04-18 | Discharge: 2009-04-18 | Payer: Self-pay | Admitting: Gynecologic Oncology

## 2009-04-18 ENCOUNTER — Ambulatory Visit: Admission: RE | Admit: 2009-04-18 | Discharge: 2009-04-18 | Payer: Self-pay | Admitting: Gynecologic Oncology

## 2009-04-25 ENCOUNTER — Encounter: Payer: Self-pay | Admitting: Internal Medicine

## 2009-05-02 ENCOUNTER — Encounter: Admission: RE | Admit: 2009-05-02 | Discharge: 2009-05-02 | Payer: Self-pay | Admitting: Urology

## 2009-05-07 ENCOUNTER — Ambulatory Visit: Payer: Self-pay | Admitting: Endocrinology

## 2009-05-07 ENCOUNTER — Ambulatory Visit: Payer: Self-pay | Admitting: Internal Medicine

## 2009-05-07 DIAGNOSIS — F909 Attention-deficit hyperactivity disorder, unspecified type: Secondary | ICD-10-CM | POA: Insufficient documentation

## 2009-05-08 ENCOUNTER — Telehealth: Payer: Self-pay | Admitting: Endocrinology

## 2009-05-08 ENCOUNTER — Telehealth: Payer: Self-pay | Admitting: Internal Medicine

## 2009-05-10 ENCOUNTER — Telehealth: Payer: Self-pay | Admitting: Internal Medicine

## 2009-05-13 ENCOUNTER — Encounter: Payer: Self-pay | Admitting: Internal Medicine

## 2009-05-14 ENCOUNTER — Encounter: Payer: Self-pay | Admitting: Internal Medicine

## 2009-05-22 ENCOUNTER — Encounter: Payer: Self-pay | Admitting: Internal Medicine

## 2009-05-22 ENCOUNTER — Ambulatory Visit: Payer: Self-pay | Admitting: Psychiatry

## 2009-06-03 ENCOUNTER — Ambulatory Visit: Payer: Self-pay | Admitting: Psychiatry

## 2009-06-11 ENCOUNTER — Telehealth: Payer: Self-pay | Admitting: Internal Medicine

## 2009-06-12 ENCOUNTER — Telehealth: Payer: Self-pay | Admitting: Internal Medicine

## 2009-06-13 ENCOUNTER — Ambulatory Visit (HOSPITAL_COMMUNITY): Admission: RE | Admit: 2009-06-13 | Discharge: 2009-06-13 | Payer: Self-pay | Admitting: Gynecologic Oncology

## 2009-06-13 HISTORY — PX: OTHER SURGICAL HISTORY: SHX169

## 2009-06-25 ENCOUNTER — Ambulatory Visit: Admission: RE | Admit: 2009-06-25 | Discharge: 2009-06-25 | Payer: Self-pay | Admitting: Gynecologic Oncology

## 2009-07-02 ENCOUNTER — Ambulatory Visit: Payer: Self-pay | Admitting: Psychiatry

## 2009-07-10 ENCOUNTER — Ambulatory Visit: Payer: Self-pay | Admitting: Oncology

## 2009-07-11 ENCOUNTER — Ambulatory Visit: Admission: RE | Admit: 2009-07-11 | Discharge: 2009-07-11 | Payer: Self-pay | Admitting: Gynecologic Oncology

## 2009-07-12 ENCOUNTER — Encounter: Payer: Self-pay | Admitting: Internal Medicine

## 2009-07-12 ENCOUNTER — Encounter: Payer: Self-pay | Admitting: Endocrinology

## 2009-07-12 LAB — COMPREHENSIVE METABOLIC PANEL
BUN: 15 mg/dL (ref 6–23)
CO2: 20 mEq/L (ref 19–32)
Calcium: 8.6 mg/dL (ref 8.4–10.5)
Chloride: 105 mEq/L (ref 96–112)
Creatinine, Ser: 1.11 mg/dL (ref 0.40–1.20)
Total Bilirubin: 0.5 mg/dL (ref 0.3–1.2)

## 2009-07-12 LAB — CBC WITH DIFFERENTIAL/PLATELET
BASO%: 0.2 % (ref 0.0–2.0)
Basophils Absolute: 0 10*3/uL (ref 0.0–0.1)
HCT: 49 % — ABNORMAL HIGH (ref 34.8–46.6)
HGB: 16.5 g/dL — ABNORMAL HIGH (ref 11.6–15.9)
LYMPH%: 29.6 % (ref 14.0–49.7)
MCH: 31.5 pg (ref 25.1–34.0)
MCHC: 33.7 g/dL (ref 31.5–36.0)
MONO#: 0.3 10*3/uL (ref 0.1–0.9)
NEUT%: 64.8 % (ref 38.4–76.8)
Platelets: 264 10*3/uL (ref 145–400)
lymph#: 3 10*3/uL (ref 0.9–3.3)

## 2009-07-12 LAB — CONVERTED CEMR LAB
ALT: 29 units/L
Albumin: 4 g/dL
Alkaline Phosphatase: 172 units/L
BUN: 15 mg/dL
Calcium: 8.6 mg/dL
Chloride: 105 meq/L
Creatinine, Ser: 1.11 mg/dL
Glucose, Bld: 120 mg/dL
Hemoglobin: 16.5 g/dL
Total Bilirubin: 0.5 mg/dL
WBC: 10 10*3/uL

## 2009-07-12 LAB — CA 125: CA 125: 9.6 U/mL (ref 0.0–30.2)

## 2009-07-15 ENCOUNTER — Ambulatory Visit: Payer: Self-pay | Admitting: Psychiatry

## 2009-07-15 ENCOUNTER — Encounter: Payer: Self-pay | Admitting: Endocrinology

## 2009-07-15 ENCOUNTER — Encounter: Payer: Self-pay | Admitting: Internal Medicine

## 2009-07-24 ENCOUNTER — Ambulatory Visit: Payer: Self-pay | Admitting: Psychiatry

## 2009-07-30 ENCOUNTER — Ambulatory Visit: Payer: Self-pay | Admitting: Psychiatry

## 2009-08-06 ENCOUNTER — Ambulatory Visit: Payer: Self-pay | Admitting: Psychiatry

## 2009-08-09 ENCOUNTER — Ambulatory Visit: Payer: Self-pay | Admitting: Internal Medicine

## 2009-08-12 LAB — CONVERTED CEMR LAB: TSH: 4.55 microintl units/mL (ref 0.35–5.50)

## 2009-08-19 ENCOUNTER — Ambulatory Visit: Payer: Self-pay | Admitting: Psychiatry

## 2009-08-19 ENCOUNTER — Encounter: Payer: Self-pay | Admitting: Internal Medicine

## 2009-08-20 ENCOUNTER — Encounter: Admission: RE | Admit: 2009-08-20 | Discharge: 2009-08-20 | Payer: Self-pay | Admitting: Oncology

## 2009-09-17 ENCOUNTER — Ambulatory Visit: Payer: Self-pay | Admitting: Internal Medicine

## 2009-09-17 ENCOUNTER — Telehealth: Payer: Self-pay | Admitting: Internal Medicine

## 2009-09-17 DIAGNOSIS — F411 Generalized anxiety disorder: Secondary | ICD-10-CM | POA: Insufficient documentation

## 2009-09-23 ENCOUNTER — Telehealth: Payer: Self-pay | Admitting: Internal Medicine

## 2009-09-30 ENCOUNTER — Telehealth: Payer: Self-pay | Admitting: Internal Medicine

## 2009-10-06 IMAGING — CT CT ABDOMEN W/ CM
2 of 6 series · 17 of 46 positions shown, 19 images · IV contrast (agent unspecified)
Comparison: 04/23/2008

 CT ABDOMEN

October 18, 2008 –DUPLICATE COPY for exam association in [facility]. No change from original report.
CLINICAL DATA: Ovarian carcinoma. Chemotherapy complete [DATE]

 CT ABDOMEN AND PELVIS WITH CONTRAST
TECHNIQUE: Multidetector CT imaging of the abdomen and pelvis was
 performed using the standard protocol following bolus
 administration of intravenous contrast.
 Contrast: 125 ml Emnipaque-FTT IV and oral contrast media.

[Series 2: rtn a/p with xxl · axial · 0.98mm/px · z∈[-510,-94]mm · 14 of 97 slices shown, 16 images]
[im 7/97  soft-tissue]
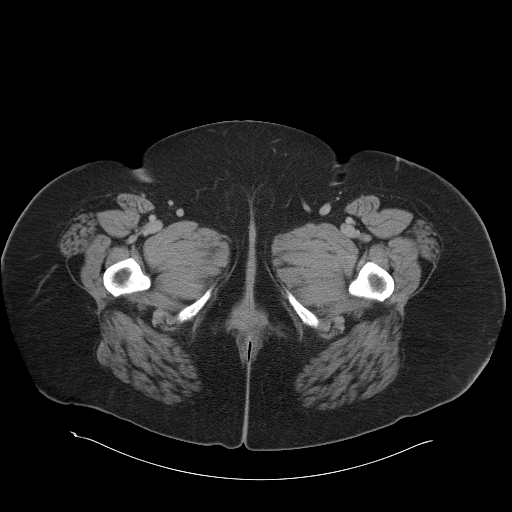
[im 7/97  bone]
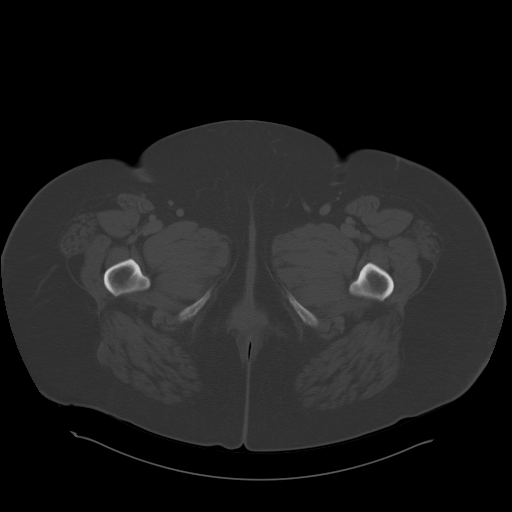
[im 13/97  soft-tissue]
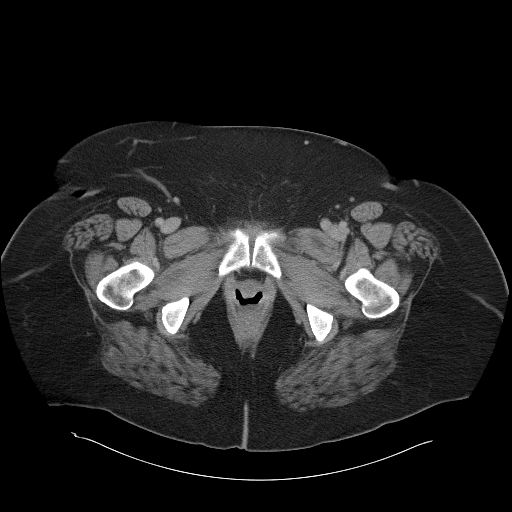
[im 20/97  soft-tissue]
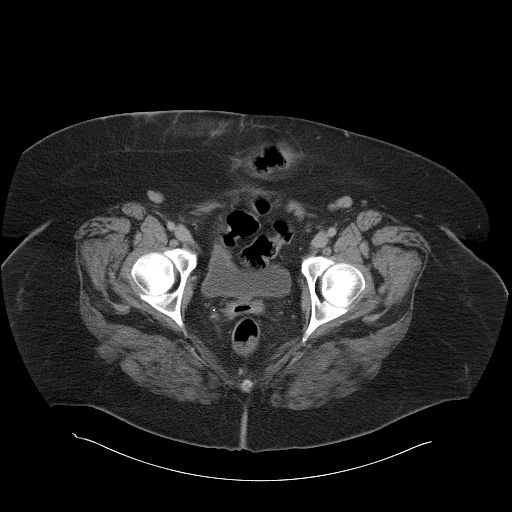
[im 26/97  soft-tissue]
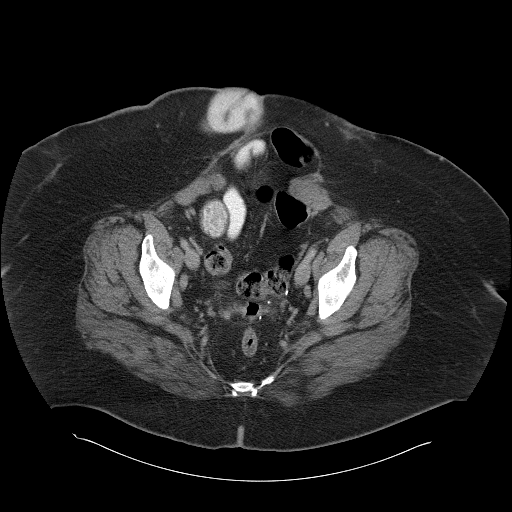
[im 33/97  soft-tissue]
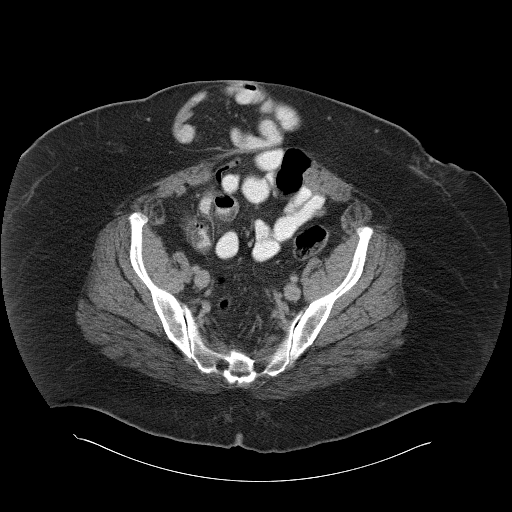
[im 39/97  soft-tissue]
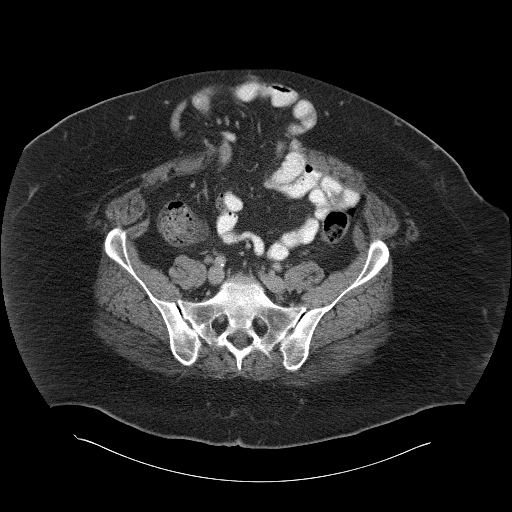
[im 45/97  soft-tissue]
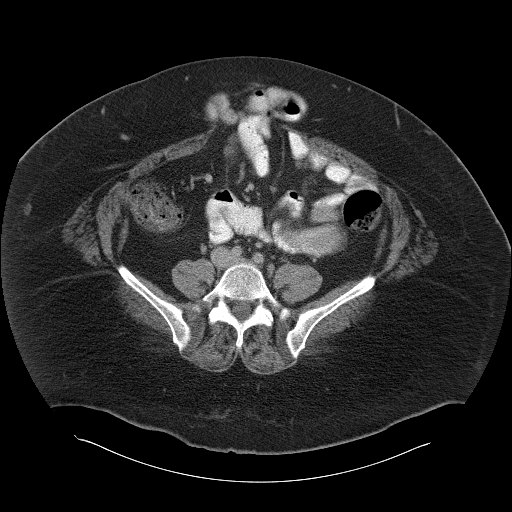
[im 52/97  soft-tissue]
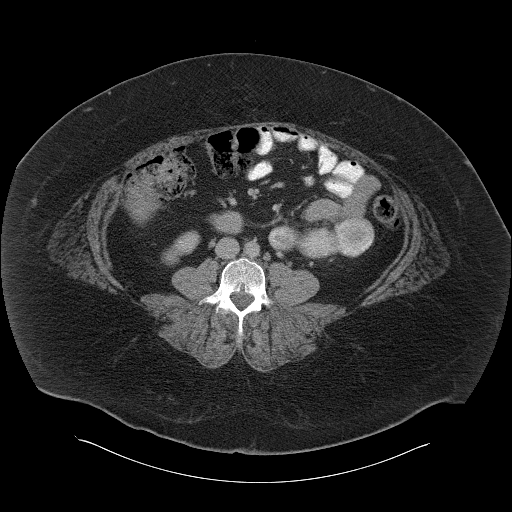
[im 58/97  soft-tissue]
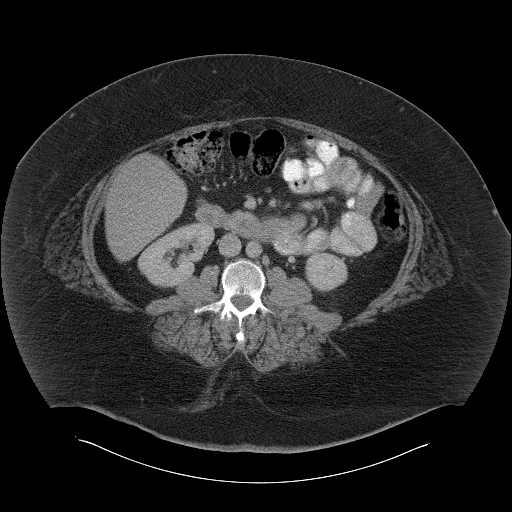
[im 58/97  bone]
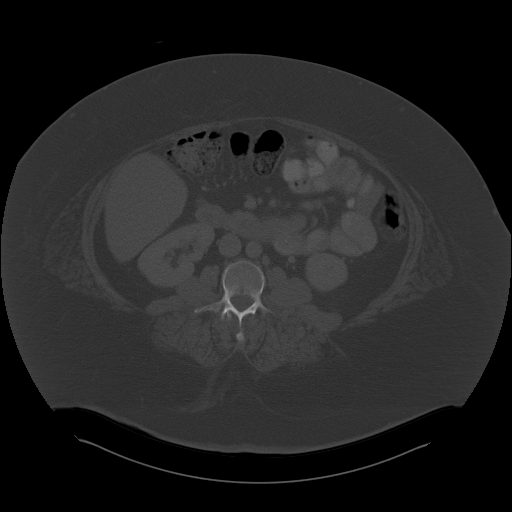
[im 65/97  soft-tissue]
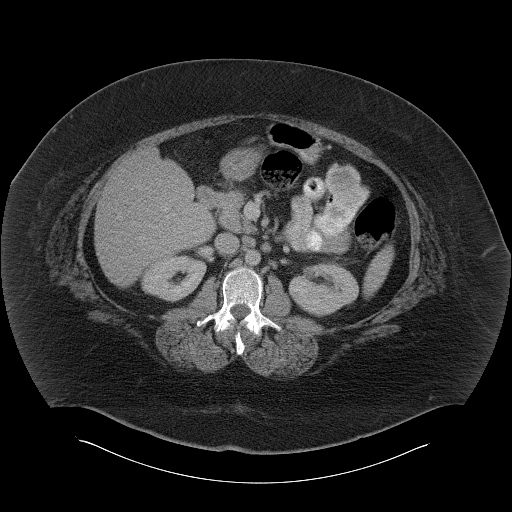
[im 71/97  soft-tissue]
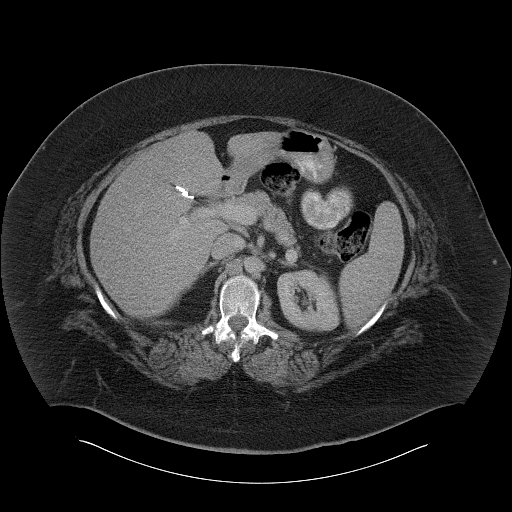
[im 77/97  soft-tissue]
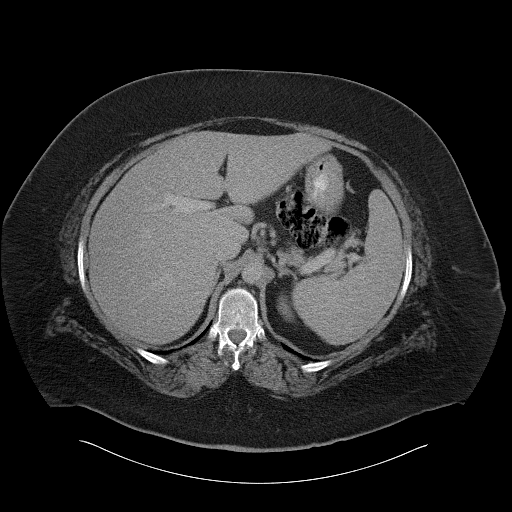
[im 84/97  soft-tissue]
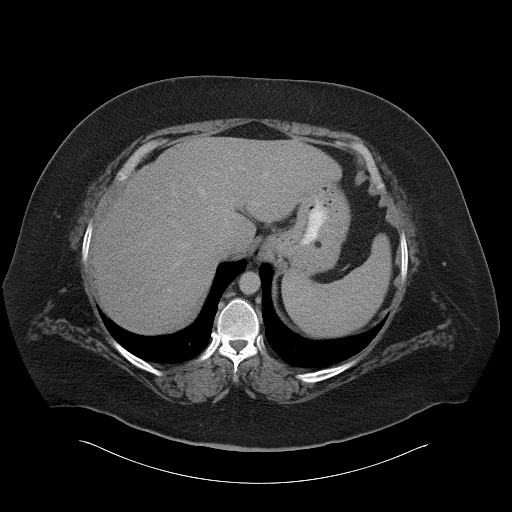
[im 90/97  soft-tissue]
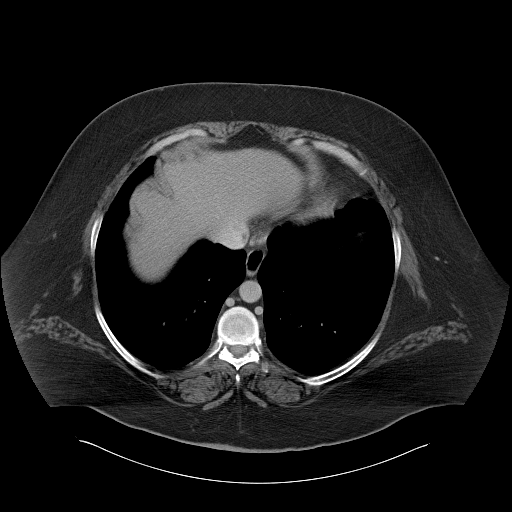

[Series 602: <mpr thick range> · coronal · 0.98mm/px · 3 of 124 slices shown]
[im 42/124  soft-tissue]
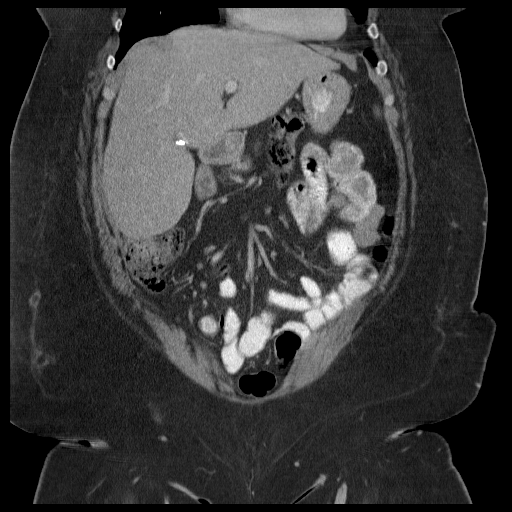
[im 55/124  soft-tissue]
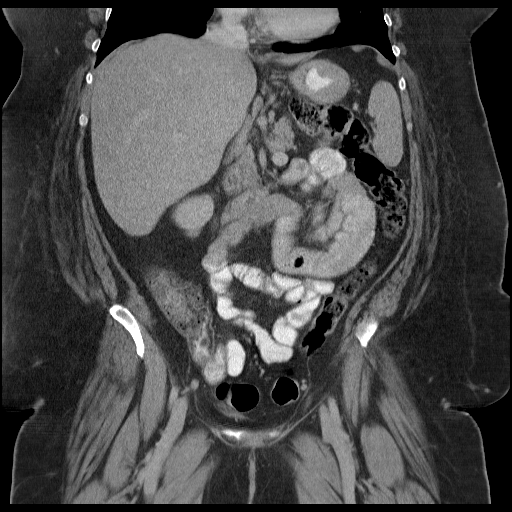
[im 69/124  soft-tissue]
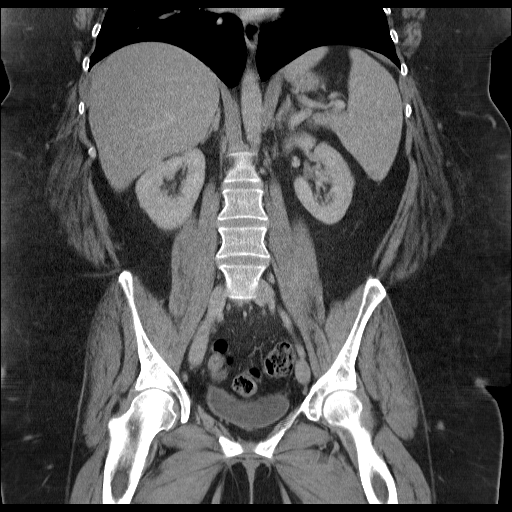

[17 of 46 positions shown; findings below may reference images not displayed]

FINDINGS: Stable small clusters of epicardial lymph nodes are
 again noted. The nodes are not pathologically enlarged. No change
 complex cystic lesion lower pole left kidney. Cholecystectomy
 clips. Liver, spleen, pancreas, and adrenal glands appear
 unremarkable.There is a 1 cm diameter noted just anterior to the
 abdominal aorta (image 33). It is stable in size. There is a 1 cm
 gastrohepatic node which has stable. See image 20. The nodes are
 basically at the upper limits of normal in size.
IMPRESSION: Upper limits of normal size lymph nodes had multiple sites are
 stable. No evidence of progressive adenopathy. Stable complex
 mass lower pole left kidney.

 CT PELVIS
FINDINGS: Extensive ventral hernia stable. No evidence of
 incarceration/bowel obstruction. No pelvic mass or pathologically
 enlarged lymph nodes. Stable small obturator nodes bilaterally.
 Stable inguinal nodes. Hysterectomy. Bony pelvis intact.
IMPRESSION: Ventral hernia containing small bowel loops without obstruction.
 Stable slightly prominent sized but not pathologically enlarged
 pelvic lymph nodes. Stable bilateral inguinal nodes.

## 2009-10-14 ENCOUNTER — Telehealth: Payer: Self-pay | Admitting: Internal Medicine

## 2009-10-18 ENCOUNTER — Ambulatory Visit: Payer: Self-pay | Admitting: Internal Medicine

## 2009-11-07 ENCOUNTER — Ambulatory Visit: Admission: RE | Admit: 2009-11-07 | Discharge: 2009-11-07 | Payer: Self-pay | Admitting: Gynecologic Oncology

## 2009-11-07 ENCOUNTER — Encounter: Payer: Self-pay | Admitting: Internal Medicine

## 2009-11-08 ENCOUNTER — Telehealth (INDEPENDENT_AMBULATORY_CARE_PROVIDER_SITE_OTHER): Payer: Self-pay | Admitting: *Deleted

## 2009-12-02 ENCOUNTER — Encounter: Payer: Self-pay | Admitting: Internal Medicine

## 2010-01-08 ENCOUNTER — Ambulatory Visit: Payer: Self-pay | Admitting: Oncology

## 2010-01-10 ENCOUNTER — Encounter: Payer: Self-pay | Admitting: Internal Medicine

## 2010-01-10 LAB — CBC WITH DIFFERENTIAL/PLATELET
Basophils Absolute: 0 10*3/uL (ref 0.0–0.1)
EOS%: 1.4 % (ref 0.0–7.0)
Eosinophils Absolute: 0.1 10*3/uL (ref 0.0–0.5)
HCT: 52.7 % — ABNORMAL HIGH (ref 34.8–46.6)
HGB: 17.9 g/dL — ABNORMAL HIGH (ref 11.6–15.9)
MCH: 31.7 pg (ref 25.1–34.0)
MONO#: 0.5 10*3/uL (ref 0.1–0.9)
NEUT#: 5.9 10*3/uL (ref 1.5–6.5)
RDW: 15.1 % — ABNORMAL HIGH (ref 11.2–14.5)
WBC: 9 10*3/uL (ref 3.9–10.3)
lymph#: 2.5 10*3/uL (ref 0.9–3.3)

## 2010-01-10 LAB — COMPREHENSIVE METABOLIC PANEL
AST: 36 U/L (ref 0–37)
Albumin: 4.3 g/dL (ref 3.5–5.2)
BUN: 12 mg/dL (ref 6–23)
CO2: 25 mEq/L (ref 19–32)
Calcium: 9.6 mg/dL (ref 8.4–10.5)
Chloride: 102 mEq/L (ref 96–112)
Potassium: 4 mEq/L (ref 3.5–5.3)

## 2010-01-10 LAB — CA 125: CA 125: 11.1 U/mL (ref 0.0–30.2)

## 2010-01-28 ENCOUNTER — Ambulatory Visit: Payer: Self-pay | Admitting: Internal Medicine

## 2010-01-28 LAB — CONVERTED CEMR LAB: Hgb A1c MFr Bld: 6.5 % (ref 4.6–6.5)

## 2010-02-13 ENCOUNTER — Telehealth: Payer: Self-pay | Admitting: Internal Medicine

## 2010-03-17 ENCOUNTER — Ambulatory Visit: Payer: Self-pay | Admitting: Internal Medicine

## 2010-03-19 LAB — CONVERTED CEMR LAB: TSH: 1.24 microintl units/mL (ref 0.35–5.50)

## 2010-04-26 ENCOUNTER — Other Ambulatory Visit: Payer: Self-pay | Admitting: Oncology

## 2010-04-26 DIAGNOSIS — Z0000 Encounter for general adult medical examination without abnormal findings: Secondary | ICD-10-CM

## 2010-04-29 ENCOUNTER — Ambulatory Visit: Admit: 2010-04-29 | Payer: Self-pay | Admitting: Internal Medicine

## 2010-04-30 ENCOUNTER — Ambulatory Visit
Admission: RE | Admit: 2010-04-30 | Discharge: 2010-04-30 | Payer: Self-pay | Source: Home / Self Care | Attending: Gynecologic Oncology | Admitting: Gynecologic Oncology

## 2010-04-30 ENCOUNTER — Ambulatory Visit: Payer: Self-pay | Admitting: Oncology

## 2010-04-30 LAB — CA 125: CA 125: 12 U/mL (ref 0.0–30.2)

## 2010-05-04 LAB — CONVERTED CEMR LAB
Blood in Urine, dipstick: NEGATIVE
Glucose, Urine, Semiquant: NEGATIVE
pH: 7
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
## 2010-05-06 NOTE — Letter (Signed)
[facility]   Imported By: Sherian Rein 05/09/2009 08:31:56  _____________________________________________________________________  External Attachment:    Type:   Image     Comment:   External Document

## 2010-05-06 NOTE — Consult Note (Signed)
[facility]   Imported By: Sherian Rein 06/03/2009 15:11:56  _____________________________________________________________________  External Attachment:    Type:   Image     Comment:   External Document

## 2010-05-06 NOTE — Letter (Signed)
Summary: [facility]  [facility]   Imported By: Sherian Rein 01/31/2010 09:24:46  _____________________________________________________________________  External Attachment:    Type:   Image     Comment:   External Document

## 2010-05-06 NOTE — Assessment & Plan Note (Signed)
Summary: 3 MTH FU  STC   Vital Signs:  Patient profile:   45 year old female Height:      67.5 inches (171.45 cm) Weight:      352.8 pounds (160.36 kg) O2 Sat:      90 % on Room air Temp:     97.8 degrees F (36.56 degrees C) oral Pulse rate:   79 / minute BP sitting:   120 / 72  (left arm) Cuff size:   large  Vitals Entered By: Orlan Leavens (May 07, 2009 11:01 AM)  O2 Flow:  Room air CC: 3 month follow-up Is Patient Diabetic? Yes Did you bring your meter with you today? No Pain Assessment Patient in pain? no        Primary Care Provider:  Newt Lukes MD  CC:  3 month follow-up.  History of Present Illness: here today for followup:  1) DM2 - follows with dr. Everardo All - currently diet controlled reports sugars have been "ok" - denies hypoglycemia events ?if going to resume metformin due to weight gain-- sees him this afternoon  2) ovarian cancer - s/p surg and chemo 2009 follows with dr. Darrold Span (onc) and gyn ([facility]) for same recent dx of high grade vaginal dysplasia -- no word on plans for tx of this yet -  3) chronic pain - related low back and left knee has seen ortho (dr. Charlett Blake) several times in pat 3 mos for same - on voltaren + vicodin - sometimes none, never more than 4/d expresses understanding that her weight is contributing to the pain  4) HTN - no recent med changes no adv SE of meds  5) depression/anxiety long hx same (started age 16s) - on meds for same feels symptoms not controlled with current tx - previously also on strattera with better results counseling in past but none currenlty  6) morbid obesity -  weight gain reviewed - up >10# in 3 mos pt easily acknolwdges the causes for it and assoc problems of ortho pains  Preventive Screening-Counseling & Management  Alcohol-Tobacco     Smoking Cessation Counseling: yes  Current Medications (verified): 1)  Nexium 40 Mg  Cpdr (Esomeprazole Magnesium) .... Take 1 Tablet By Mouth  Once A Day 2)  Allegra 180 Mg Tabs (Fexofenadine Hcl) .... Take One (1) Tablet Every Morning 3)  Celexa 40 Mg Tabs (Citalopram Hydrobromide) .... Take One (1) Tablet Every Morning 4)  Hydrochlorothiazide 25 Mg Tabs (Hydrochlorothiazide) .... Take One (1) Tablet Every Morning 5)  Trazodone Hcl 50 Mg  Tabs (Trazodone Hcl) .Marland Kitchen.. 1 Tablet By Mouth Daily As Needed For Anxiety 6)  Truetest Test   Strp (Glucose Blood) .... Dx 250.02 Check Blood Sugar Twice Daily 7)  At Last Lancets   Misc (Lancets) .... To Use With True Track Meter Check Blood Sugar Two Times A Day 8)  Levothyroxine Sodium 125 Mcg Tabs (Levothyroxine Sodium) .... 2 Qd 9)  Hydrocodone-Acetaminophen 7.5-325 Mg Tabs (Hydrocodone-Acetaminophen) .... Take 1 Every 6 Hours As Needed Only For Severe Pain 10)  Flexeril 10 Mg Tabs (Cyclobenzaprine Hcl) .... Take 1 By Mouth Qd 11)  Diclofenac Sodium 75 Mg Tbec (Diclofenac Sodium) .... Take 1 Two Times A Day  Allergies (verified): 1)  ! 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  The patient denies anorexia, fever, chest pain, syncope, headaches, and abdominal pain.    Physical Exam  General:  morbidly obese.  alert, well-developed, well-nourished, and cooperative to examination.     Lungs:  normal respiratory effort, no intercostal retractions or use of accessory muscles; normal breath sounds bilaterally - no crackles and no wheezes.    Heart:  normal rate, regular rhythm, no murmur, and no rub. BLE without edema.  Psych:  Oriented X3, memory intact for recent and remote, normally interactive,  good eye contact, not anxious appearing, not depressed appearing, and not agitated.      Impression & Recommendations:  Problem # 1:  ADHD (ICD-314.01)  will start strattera in addition to current meds for depression given psyc inability to focus on taking care of self -  also refer for behav health though has good insight into her problems  Orders: Psychology Referral (Psychology)  Problem # 2:  DEPRESSION (ICD-311)  see above re: ADHD Her updated medication list for this problem includes:    Celexa 40 Mg Tabs (Citalopram hydrobromide) .Marland Kitchen... Take one (1) tablet every morning    Trazodone Hcl 50 Mg Tabs (Trazodone hcl) .Marland Kitchen... 1 tablet by mouth daily as needed for anxiety  Orders: Psychology Referral (Psychology)  Problem # 3:  OBESITY, MORBID (ICD-278.01) weight gain reviewed including her obsticles to caring for self - pt expressed understanding of need for life style changes - reports intrest in doing so but "inablility" to do it -- hopefully changing tx for ADHD and depression will help - Ht: 67.5 (05/07/2009)   Wt: 352.8 (05/07/2009)   BMI: 53.12 (10/16/2008)  Problem # 4:  CHRONIC PAIN SYNDROME (ICD-338.4)  source is from back -  again reviewed relation of symptoms to obesity reviewed mgmt plans as outlined by ortho - copnt same with renewal on current rx provided  Problem # 5:  ADENOCARCINOMA, OVARY, LEFT (ICD-183.0)  ?new vaginal dysplasia at present -  to f/u soon advised to quit smoking!!! cont tx as per dr. Darrold Span and gyn onc  Complete Medication List: 1)  Nexium 40 Mg Cpdr (Esomeprazole magnesium) .... Take 1 tablet by mouth once a day 2)  Allegra 180 Mg Tabs (Fexofenadine hcl) .... Take one (1) tablet every morning 3)  Celexa 40 Mg Tabs (Citalopram hydrobromide) .... Take one (1) tablet every morning 4)  Hydrochlorothiazide 25 Mg Tabs (Hydrochlorothiazide) .... Take one (1) tablet every morning 5)  Trazodone Hcl 50 Mg Tabs (Trazodone hcl) .Marland Kitchen.. 1 tablet by  mouth daily as needed for anxiety 6)  Truetest Test Strp (Glucose blood) .... Dx 250.02 check blood sugar twice daily 7)  At Last Lancets Misc (Lancets) .... To use with true track meter check blood sugar two times a day 8)  Levothyroxine Sodium 125 Mcg Tabs (Levothyroxine sodium) .... 2 qd 9)  Hydrocodone-acetaminophen 7.5-325 Mg Tabs (Hydrocodone-acetaminophen) .... Take 1 every 6 hours as needed only for severe pain 10)  Flexeril 10 Mg Tabs (Cyclobenzaprine hcl) .... Take 1 by mouth qd 11)  Diclofenac Sodium 75 Mg Tbec (Diclofenac sodium) .... Take 1 two times a day 12)  Strattera 40 Mg Caps (Atomoxetine hcl) .Marland Kitchen.. 1 by mouth once daily  Patient Instructions: 1)  it was good to see you today.  2)  start strattera as discussed and continue  other medications as ongoing - 3)  we'll make referral for counseling. Our office will contact you regarding this appointment once made.  4)  it is important that you work on losing weight - monitor your diet and consume fewer calories such as less carbohydrates (sugar) and less fat. you also need to increase your physical activity level - start by walking for 10-20 minutes 3 times per week and work up to 30 minutes 4-5 times each week.  5)  Tobacco is very bad for your health and your loved ones! You Should stop smoking! 6)  Please schedule a follow-up appointment in 3 months, sooner if problems.  Prescriptions: HYDROCODONE-ACETAMINOPHEN 7.5-325 MG TABS (HYDROCODONE-ACETAMINOPHEN) take 1 every 6 hours as needed only for severe pain  #90 x 2   Entered and Authorized by:   Newt Lukes MD   Signed by:   Newt Lukes MD on 05/07/2009   Method used:   Print then Give to Patient   RxID:   [id number] STRATTERA 40 MG CAPS (ATOMOXETINE HCL) 1 by mouth once daily  #30 x 2   Entered and Authorized by:   Newt Lukes MD   Signed by:   Newt Lukes MD on 05/07/2009   Method used:   Print then Give to Patient   RxID:    [id number]

## 2010-05-06 NOTE — Progress Notes (Signed)
Summary: Status referral  Phone Note Call from Patient Call back at Home Phone [phone]   Caller: Patient Summary of Call: Pt called to check status of referral for hernia repair. Pt states she had OV with GYN yesterday who stressed to pt that it is very important to have repair done as soon as possible. Initial call taken by: Margaret Pyle, CMA,  November 08, 2009 10:43 AM  Follow-up for Phone Call        order requested 7/15 - -- check with [facility] - thx Newt Lukes MD  November 08, 2009 10:50 AM

## 2010-05-06 NOTE — Progress Notes (Signed)
Summary: referral  Phone Note Call from Patient Call back at Home Phone [phone]   Caller: Patient Summary of Call: Pt called requesting referral to have hernia repaired. I called pt to ask if she was having any symptoms. Pt advised that "it was the last thing to do because she has gotten everything else fixed". Pt would not explain, stating that VAL knows. Please advise on referral Initial call taken by: Margaret Pyle, CMA,  October 14, 2009 9:30 AM  Follow-up for Phone Call        referral to gen surg requested for eval and tx ventral hernia Follow-up by: Newt Lukes MD,  October 14, 2009 11:00 AM

## 2010-05-06 NOTE — Progress Notes (Signed)
Summary: Cold sxs  Phone Note Call from Patient Call back at Home Phone [phone]   Caller: Patient Summary of Call: Pt called stating that her cold symptoms are not any better. Pt is requesting ABX and/or cough syrup. Initial call taken by: Margaret Pyle, CMA,  September 23, 2009 10:54 AM  Follow-up for Phone Call        zpak erx done - use Mucinex DM two times a day for cough Follow-up by: Newt Lukes MD,  September 23, 2009 11:35 AM  Additional Follow-up for Phone Call Additional follow up Details #1::        pt informed Additional Follow-up by: Margaret Pyle, CMA,  September 23, 2009 11:54 AM    New/Updated Medications: AZITHROMYCIN 250 MG TABS (AZITHROMYCIN) 2 tabs by mouth today, then 1 by mouth daily starting tomorrow Prescriptions: AZITHROMYCIN 250 MG TABS (AZITHROMYCIN) 2 tabs by mouth today, then 1 by mouth daily starting tomorrow  #6 x 0   Entered and Authorized by:   Newt Lukes MD   Signed by:   Newt Lukes MD on 09/23/2009   Method used:   Electronically to        [facility] [address] [phone]* (retail)       [address]       [place], [place]  [address]       Ph: [phone] or [phone]       Fax: [phone]   RxID:   [id number]

## 2010-05-06 NOTE — Progress Notes (Signed)
Summary: strattera  Phone Note Refill Request Message from:  Fax from Pharmacy on February 13, 2010 1:12 PM  Refills Requested: Medication #1:  STRATTERA 40 MG CAPS 1 by mouth once daily   Last Refilled: 01/14/2010  Method Requested: Electronic Initial call taken by: Orlan Leavens RMA,  February 13, 2010 1:12 PM    Prescriptions: STRATTERA 40 MG CAPS (ATOMOXETINE HCL) 1 by mouth once daily  #30 Each x 5   Entered by:   Orlan Leavens RMA   Authorized by:   Newt Lukes MD   Signed by:   Orlan Leavens RMA on 02/13/2010   Method used:   Electronically to        [facility] [address] [phone]* (retail)       [address]       [place], [place]  [address]       Ph: [phone] or [phone]       Fax: [phone]   RxID:   [id number]

## 2010-05-06 NOTE — Medication Information (Signed)
[facility]   Imported By: Lester Allakaket 08/26/2009 11:47:59  _____________________________________________________________________  External Attachment:    Type:   Image     Comment:   External Document

## 2010-05-06 NOTE — Progress Notes (Signed)
Summary: Chantix PA  Phone Note From Pharmacy   Caller: [facility] Summary of Call: PA request--Chantix starting pack. They will fax forms. Initial call taken by: Lucious Groves,  May 10, 2009 3:47 PM  Follow-up for Phone Call        form completed and signed - into [facility] basket side b Follow-up by: Newt Lukes MD,  May 14, 2009 9:37 AM  Additional Follow-up for Phone Call Additional follow up Details #1::        Faxed and will wait for reply. Additional Follow-up by: Lucious Groves,  May 14, 2009 10:01 AM    Additional Follow-up for Phone Call Additional follow up Details #2::    Med is approved 04/23/2009 thru 11/10/2009, [facility] will contact patient.  Follow-up by: Lamar Sprinkles, CMA,  May 16, 2009 10:09 AM

## 2010-05-06 NOTE — Progress Notes (Signed)
Summary: truetrack strips  Phone Note Refill Request Message from:  Fax from Pharmacy on June 12, 2009 8:46 AM  Refills Requested: Medication #1:  TRUETEST TEST   STRP Dx 250.02 check blood sugar twice daily # 50  Method Requested: Electronic Initial call taken by: Orlan Leavens,  June 12, 2009 8:46 AM    New/Updated Medications: TRUETEST TEST   STRP (GLUCOSE BLOOD) Dx 250.02 check blood sugar twice daily Prescriptions: TRUETEST TEST   STRP (GLUCOSE BLOOD) Dx 250.02 check blood sugar twice daily  #50 x 6   Entered by:   Orlan Leavens   Authorized by:   Newt Lukes MD   Signed by:   Orlan Leavens on 06/12/2009   Method used:   Electronically to        [facility] [address] [phone]* (retail)       [address]       [place], [place]  [address]       Ph: [phone] or [phone]       Fax: [phone]   RxID:   [id number]

## 2010-05-06 NOTE — Assessment & Plan Note (Signed)
Summary: PER PT FEB APPT SAME DY AS DR VL APPT  STC   Vital Signs:  Patient profile:   45 year old female Height:      67.5 inches (171.45 cm) Weight:      352.8 pounds (160.36 kg) O2 Sat:      90 % on Room air Temp:     97.8 degrees F (36.56 degrees C) oral Pulse rate:   79 / minute BP sitting:   120 / 72  (left arm) Cuff size:   large  Vitals Entered By: Josph Macho CMA (May 07, 2009 1:04 PM)  O2 Flow:  Room air CC: Follow-up visit/ CF Is Patient Diabetic? Yes   Referring Provider:  Lehman Prom FNP Primary Provider:  Newt Lukes MD  CC:  Follow-up visit/ CF.  History of Present Illness: pt is concerned about ongoing weight gain.   pt states she otherwise feels well in general.  no cbg record, but states cbg's are well-controlled.  Current Medications (verified): 1)  Nexium 40 Mg  Cpdr (Esomeprazole Magnesium) .... Take 1 Tablet By Mouth Once A Day 2)  Allegra 180 Mg Tabs (Fexofenadine Hcl) .... Take One (1) Tablet Every Morning 3)  Celexa 40 Mg Tabs (Citalopram Hydrobromide) .... Take One (1) Tablet Every Morning 4)  Hydrochlorothiazide 25 Mg Tabs (Hydrochlorothiazide) .... Take One (1) Tablet Every Morning 5)  Trazodone Hcl 50 Mg  Tabs (Trazodone Hcl) .Marland Kitchen.. 1 Tablet By Mouth Daily As Needed For Anxiety 6)  Truetest Test   Strp (Glucose Blood) .... Dx 250.02 Check Blood Sugar Twice Daily 7)  At Last Lancets   Misc (Lancets) .... To Use With True Track Meter Check Blood Sugar Two Times A Day 8)  Levothyroxine Sodium 125 Mcg Tabs (Levothyroxine Sodium) .... 2 Qd 9)  Hydrocodone-Acetaminophen 7.5-325 Mg Tabs (Hydrocodone-Acetaminophen) .... Take 1 Every 6 Hours As Needed Only For Severe Pain 10)  Flexeril 10 Mg Tabs (Cyclobenzaprine Hcl) .... Take 1 By Mouth Qd 11)  Diclofenac Sodium 75 Mg Tbec (Diclofenac Sodium) .... Take 1 Two Times A Day 12)  Strattera 40 Mg Caps (Atomoxetine Hcl) .Marland Kitchen.. 1 By Mouth Once Daily  Allergies (verified): 1)  ! Sulfa  Past  History:  Past Medical History: Last updated: 05/07/2009 DIABETES MELLITUS, TYPE II, UNCONTROLLED  ADENOCARCINOMA, OVARY, LEFT  VENTRAL HERNIA  BACK PAIN, chronic osteoarthritis, knees- L>R DEPRESSION/ANXIETY ADHD HEPATITIS C  OBESITY, MORBID HYPOTHYROIDISM HYPERTENSION  ALLERGIC RHINITIS  MD rooster - onc - lives[facility] gyn onc -brewster ([facility]) endo - [facility] - [facility]  Review of Systems  The patient denies dyspnea on exertion.    Physical Exam  General:  morbidly obese.   Pulses:  dorsalis pedis intact bilat.  Extremities:  no deformity.  no ulcer on the feet.  feet are of normal color and temp.  no edema  Neurologic:  sensation is intact to touch on the feet, but decreased from normal Additional Exam:  Hemoglobin A1C            6.4 %    Impression & Recommendations:  Problem # 1:  DM (ICD-250.00) well-controlled  Problem # 2:  weight gain  Medications Added to Medication List This Visit: 1)  Metformin Hcl 500 Mg Tabs (Metformin hcl) .Marland Kitchen.. 1 tab each am  Other Orders: Surgical Referral (Surgery) TLB-A1C / Hgb A1C (Glycohemoglobin) (83036-A1C) Est. Patient Level III (11914)  Patient Instructions: 1)  pending the test results, please resume metformin 500 mg each am 2)  please consider weight loss surgery.  i have made a referral. 3)  Please schedule a follow-up appointment in 6 months. Prescriptions: METFORMIN HCL 500 MG TABS (METFORMIN HCL) 1 tab each am  #30 x 11   Entered and Authorized by:   Minus Breeding MD   Signed by:   Minus Breeding MD on 05/07/2009   Method used:   Electronically to        [facility] [address] [phone]* (retail)       [address]       [place], [place]  [address]       Ph: [phone] or [phone]       Fax: [phone]   RxID:   [id number]

## 2010-05-06 NOTE — Assessment & Plan Note (Signed)
Summary: 3 MTH FU   STC   Vital Signs:  Patient profile:   45 year old female Height:      67.5 inches (171.45 cm) Weight:      352.0 pounds (160 kg) BMI:     54.51 O2 Sat:      97 % on Room air Temp:     98.5 degrees F (36.94 degrees C) oral Pulse rate:   84 / minute BP sitting:   118 / 82  (left arm) Cuff size:   large  Vitals Entered By: Orlan Leavens (Aug 09, 2009 1:33 PM)  O2 Flow:  Room air CC: 3 month follow-up/ Pt want to discuss metformin she states she stop taking due to making her sick/ vomitting Is Patient Diabetic? Yes Did you bring your meter with you today? No Pain Assessment Patient in pain? no        Primary Care Provider:  Newt Lukes MD  CC:  3 month follow-up/ Pt want to discuss metformin she states she stop taking due to making her sick/ vomitting.  History of Present Illness: here for 3 mo f/u   1) DM2 - follows with dr. Everardo All - currently diet controlled because not taking metformin reports sugars have been "ok" - denies hypoglycemia events  2) ovarian cancer - s/p surg and chemo 2009 follows with dr. 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 Charlett Blake) several times in pat 3 mos for same - on voltaren + vicodin - sometimes none, never more than 4/d expresses understanding that her weight is contributing to the pain  4) HTN - no recent med changes no adv SE of meds  5) depression/anxiety long hx same (started age 29s) - on meds for same feels symptoms not controlled with current tx -  also on strattera with better results counseling started since last OV -doing well with this  6) morbid obesity -  weight gain reviewed - pt easily acknolwdges the causes for it and assoc problems of ortho pains would like to see nutritionist  Clinical Review Panels:  Lipid Management   Cholesterol:  148  (06/01/2007)   LDL (bad choesterol):  96 (06/01/2007)   HDL (good cholesterol):  20 (06/01/2007)  Diabetes Management   HgBA1C:  6.4 (05/07/2009)   Creatinine:  1.11 (07/12/2009)  CBC   WBC:  10.0 (07/12/2009)   RBC:  4.80 (03/13/2008)   Hgb:  16.5 (07/12/2009)   Hct:  43.5 (03/13/2008)   Platelets:  264 (07/12/2009)   MCV  90.6 (03/13/2008)   MCHC  30.6 (03/13/2008)   RDW  23.0 (03/13/2008)   PMN:  58 (03/13/2008)   Lymphs:  28 (03/13/2008)   Monos:  12 (03/13/2008)   Eosinophils:  1 (03/13/2008)   Basophil:  1 (03/13/2008)  Complete Metabolic Panel   Glucose:  120 (07/12/2009)   Sodium:  138 (07/12/2009)   Potassium:  4.5 (07/12/2009)   Chloride:  105 (07/12/2009)   CO2:  20 (07/12/2009)   BUN:  15 (07/12/2009)   Creatinine:  1.11 (07/12/2009)   Albumin:  4.0 (07/12/2009)   Total Protein:  6.4 (03/13/2008)   Calcium:  8.6 (07/12/2009)   Total Bili:  0.5 (07/12/2009)   Alk Phos:  172 (07/12/2009)   SGPT (ALT):  29 (07/12/2009)   SGOT (AST):  29 (07/12/2009)   Current  Medications (verified): 1)  Nexium 40 Mg  Cpdr (Esomeprazole Magnesium) .... Take 1 Tablet By Mouth Once A Day 2)  Allegra 180 Mg Tabs (Fexofenadine Hcl) .... Take One (1) Tablet Every Morning 3)  Celexa 40 Mg Tabs (Citalopram Hydrobromide) .... Take One (1) Tablet Every Morning 4)  Hydrochlorothiazide 25 Mg Tabs (Hydrochlorothiazide) .... Take One (1) Tablet Every Morning 5)  Trazodone Hcl 50 Mg  Tabs (Trazodone Hcl) .Marland Kitchen.. 1 Tablet By Mouth Daily As Needed For Anxiety 6)  Truetest Test   Strp (Glucose Blood) .... Dx 250.02 Check Blood Sugar Twice Daily 7)  At Last Lancets   Misc (Lancets) .... To Use With True Track Meter Check Blood Sugar Two Times A Day 8)  Levothyroxine Sodium 125 Mcg Tabs (Levothyroxine Sodium) .... 2 Qd 9)  Hydrocodone-Acetaminophen 7.5-325 Mg Tabs (Hydrocodone-Acetaminophen) .... Take 1 Every 6 Hours As Needed Only For Severe Pain 10)  Flexeril 10 Mg Tabs (Cyclobenzaprine Hcl)  .... Take 1 By Mouth Qd 11)  Diclofenac Sodium 75 Mg Tbec (Diclofenac Sodium) .... Take 1 Two Times A Day 12)  Strattera 40 Mg Caps (Atomoxetine Hcl) .Marland Kitchen.. 1 By Mouth Once Daily 13)  Metformin Hcl 500 Mg Tabs (Metformin Hcl) .Marland Kitchen.. 1 Tab Each Am 14)  Chantix Starting Month Pak 0.5 Mg X 11 & 1 Mg X 42 Tabs (Varenicline Tartrate) .... As Directed  Allergies (verified): 1)  ! Sulfa  Past History:  Past Medical History: DIABETES MELLITUS, TYPE II, UNCONTROLLED  ADENOCARCINOMA, OVARY, LEFT  VENTRAL HERNIA  BACK PAIN, chronic   osteoarthritis, knees- L>R DEPRESSION/ANXIETY ADHD HEPATITIS C  OBESITY, MORBID HYPOTHYROIDISM HYPERTENSION  ALLERGIC RHINITIS  MD rooster - onc - lives[facility] gyn onc -brewster ([facility]) endo - [facility] - [facility] psyc - jean peters  Review of Systems  The patient denies fever, weight gain, and headaches.     Physical Exam  General:  morbidly obese.  alert, well-developed, well-nourished, and cooperative to examination.  Lungs:  normal respiratory effort, no intercostal retractions or use of accessory muscles; normal breath sounds bilaterally - no crackles and no wheezes.    Heart:  normal rate, regular rhythm, no murmur, and no rub. BLE without edema.  Psych:  Oriented X3, memory intact for recent and remote, normally interactive, good eye contact, not anxious appearing, not depressed appearing, and not agitated.      Impression & Recommendations:  Problem # 1:  DM (ICD-250.00) diet only at present as intol of metformin (vomitting) reck labs now, refer to nutrtionist The following medications were removed from the medication list:    Metformin Hcl 500 Mg Tabs (Metformin hcl) .Marland Kitchen... 1 tab each am  Orders: TLB-A1C / Hgb A1C (Glycohemoglobin) (83036-A1C) Nutrition Referral (Nutrition)  Problem # 2:  OBESITY, MORBID (ICD-278.01)  Orders: Nutrition Referral (Nutrition)  weight gain reviewed including her obsticles to caring for self - pt  expressed understanding of need for life style changes -  hopefully changing tx for ADHD and depression will help - Ht: 67.5 (05/07/2009)   Wt: 352.8 (05/07/2009)   BMI: 53.12 (10/16/2008)  Ht: 67.5 (08/09/2009)   Wt: 352.0 (08/09/2009)   BMI: 54.51 (08/09/2009)  Problem # 3:  HYPERTENSION (ICD-401.9)  Her updated medication list for this problem includes:    Hydrochlorothiazide 25 Mg Tabs (Hydrochlorothiazide) .Marland Kitchen... Take one (1) tablet every morning  BP today: 118/82 Prior BP: 120/72 (05/07/2009)  Labs Reviewed: K+: 4.5 (07/12/2009) Creat: : 1.11 (07/12/2009)   Chol: 148 (06/01/2007)   HDL: 20 (06/01/2007)  LDL: 96 (06/01/2007)   TG: 160 (06/01/2007)  Problem # 4:  HYPOTHYROIDISM (ICD-244.9)  Her updated medication list for this problem includes:    Levothyroxine Sodium 125 Mcg Tabs (Levothyroxine sodium) .Marland Kitchen... 2 qd  Labs Reviewed: TSH: 0.47 (02/05/2009)    HgBA1c: 6.4 (05/07/2009) Chol: 148 (06/01/2007)   HDL: 20 (06/01/2007)   LDL: 96 (06/01/2007)   TG: 160 (06/01/2007)  Orders: TLB-TSH (Thyroid Stimulating Hormone) (84443-TSH)  Problem # 5:  DEPRESSION (ICD-311)  Her updated medication list for this problem includes:    Celexa 40 Mg Tabs (Citalopram hydrobromide) .Marland Kitchen... Take one (1) tablet every morning    Trazodone Hcl 50 Mg Tabs (Trazodone hcl) .Marland Kitchen... 1 tablet by mouth daily as needed for anxiety  Complete Medication List: 1)  Nexium 40 Mg Cpdr (Esomeprazole magnesium) .... Take 1 tablet by mouth once a day 2)  Allegra 180 Mg Tabs (Fexofenadine hcl) .... Take one (1) tablet every morning 3)  Celexa 40 Mg Tabs (Citalopram hydrobromide) .... Take one (1) tablet every morning 4)  Hydrochlorothiazide 25 Mg Tabs (Hydrochlorothiazide) .... Take one (1) tablet every morning 5)  Trazodone Hcl 50 Mg Tabs (Trazodone hcl) .Marland Kitchen.. 1 tablet by mouth daily as needed for anxiety 6)  Truetest Test Strp (Glucose blood) .... Dx 250.02 check blood sugar twice daily 7)  At Last Lancets Misc  (Lancets) .... To use with true track meter check blood sugar two times a day 8)  Levothyroxine Sodium 125 Mcg Tabs (Levothyroxine sodium) .... 2 qd 9)  Hydrocodone-acetaminophen 7.5-325 Mg Tabs (Hydrocodone-acetaminophen) .... Take 1 every 6 hours as needed only for severe pain 10)  Flexeril 10 Mg Tabs (Cyclobenzaprine hcl) .... Take 1 by mouth qd 11)  Diclofenac Sodium 75 Mg Tbec (Diclofenac sodium) .... Take 1 two times a day 12)  Strattera 40 Mg Caps (Atomoxetine hcl) .Marland Kitchen.. 1 by mouth once daily  Patient Instructions: 1)  it was good to see you today. 2)  test(s) ordered today - your results will be posted on the phone tree for review in 48-72 hours from the time of test completion; call [phone] and enter your 9 digit MRN (listed above on this page, just below your name); if any changes need to be made or there are abnormal results, you will be contacted directly.  3)  we'll make referral to nutrtionist for help with weight loss. Our office will contact you regarding this appointment once made.  4)  fewer hydrocodone pills as discussed - new prescription done 5)  Please schedule a follow-up appointment in 3-4 months, sooner if problems.  Prescriptions: HYDROCODONE-ACETAMINOPHEN 7.5-325 MG TABS (HYDROCODONE-ACETAMINOPHEN) take 1 every 6 hours as needed only for severe pain  #60 x 3   Entered and Authorized by:   Newt Lukes MD   Signed by:   Newt Lukes MD on 08/09/2009   Method used:   Print then Give to Patient   RxID:   [id number]

## 2010-05-06 NOTE — Assessment & Plan Note (Signed)
Summary: 4-6 WK ROV/NWS   Vital Signs:  Patient profile:   45 year old female Height:      67.5 inches (171.45 cm) Weight:      344.0 pounds (156.36 kg) O2 Sat:      98 % on Room air Temp:     98.5 degrees F (36.94 degrees C) oral Pulse rate:   88 / minute BP sitting:   124 / 82  (left arm) Cuff size:   large  Vitals Entered By: Orlan Leavens (October 18, 2009 11:01 AM)  O2 Flow:  Room air CC: 4 week follow-up Is Patient Diabetic? Yes Did you bring your meter with you today? No Pain Assessment Patient in pain? no      Comments Pt states she can only take the effexor @ bedtime, med makes her sleepy   Primary Care Provider:  Newt Lukes MD  CC:  4 week follow-up.  History of Present Illness: c/o  continued anxiety - unable to get out of the house because of panic intol of prozac, seroquel, and zoloft; trazadone and celexa-  tol generic effexor but some sleepiness, but taking only at night in therapy but "hard to go when i can't leave the house"   1) DM2 - follows with dr. Everardo All - currently diet controlled because not taking metformin reports sugars have been "ok" - denies hypoglycemia events  2) ovarian cancer - s/p surg and chemo 2009 follows with dr. 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 Charlett Blake) several times in pat 3 mos for same - on voltaren + vicodin - sometimes none, never more than 4/d expresses understanding that her weight is contributing to the pain  4) HTN - no recent med changes no adv SE of meds  5) depression/anxiety long hx same (started age 23s) - on meds for same feels symptoms not controlled with current tx -  also on strattera with better results counseling started since last OV -doing well with this  6) morbid obesity -  weight gain reviewed - pt easily acknolwdges the causes for it and  assoc problems of ortho pains would like to see nutritionist  Current Medications (verified): 1)  Nexium 40 Mg  Cpdr (Esomeprazole Magnesium) .... Take 1 Tablet By Mouth Once A Day 2)  Allegra 180 Mg Tabs (Fexofenadine Hcl) .... Take One (1) Tablet Every Morning 3)  Venlafaxine Hcl 50 Mg Tabs (Venlafaxine Hcl) .... Take 1 At Bedtime 4)  Hydrochlorothiazide 25 Mg Tabs (Hydrochlorothiazide) .... Take One (1) Tablet Every Morning 5)  Truetest Test   Strp (Glucose Blood) .... Dx 250.02 Check Blood Sugar Twice Daily 6)  At Last Lancets   Misc (Lancets) .... To Use With True Track Meter Check Blood Sugar Two Times A Day 7)  Levothyroxine Sodium 125 Mcg Tabs (Levothyroxine Sodium) .... 2 Qd 8)  Hydrocodone-Acetaminophen 7.5-325 Mg Tabs (Hydrocodone-Acetaminophen) .... Take 1 Every 6 Hours As Needed Only For Severe Pain 9)  Flexeril 10 Mg Tabs (Cyclobenzaprine Hcl) .... Take 1 By Mouth Qd 10)  Diclofenac Sodium 75 Mg Tbec (Diclofenac Sodium) .... Take 1 Two Times A Day 11)  Strattera 40 Mg Caps (Atomoxetine Hcl) .Marland Kitchen.. 1 By Mouth Once Daily 12)  Nyquil 60-7.09-02-998 Mg/32ml Liqd (Pseudoeph-Doxylamine-Dm-Apap) .... Use As Needed  Allergies (verified): 1)  ! Sulfa  Past History:  Past Medical  History: DIABETES MELLITUS, TYPE II, UNCONTROLLED  ADENOCARCINOMA, OVARY, LEFT  VENTRAL HERNIA  BACK PAIN, chronic   osteoarthritis, knees- L>R DEPRESSION/ANXIETY ADHD HEPATITIS C  OBESITY, MORBID HYPOTHYROIDISM HYPERTENSION   ALLERGIC RHINITIS  MD roster -  onc - lives[facility] gyn onc -brewster ([facility]) endo - [facility] - [facility] psyc - jean peters  Review of Systems  The patient denies anorexia, abdominal pain, and depression.    Physical Exam  General:  morbidly obese.  alert, well-developed, well-nourished, and cooperative to examination.  Lungs:  normal respiratory effort, no intercostal retractions or use of accessory muscles; normal breath sounds bilaterally - no crackles and no  wheezes.    Heart:  normal rate, regular rhythm, no murmur, and no rub. BLE without edema.  Abdomen:  well healed scar  across BLQs with small reducible ventral/periumb hernia - nontender, no inflammation - obese but soft, non-tender, normal bowel sounds, no distention; no masses and no appreciable hepatomegaly or splenomegaly.     Impression & Recommendations:  Problem # 1:  GENERALIZED ANXIETY DISORDER (ICD-300.02)  increase effexor dose and change to ext release - erx done Her updated medication list for this problem includes:    Venlafaxine Hcl 150 Mg Xr24h-tab (Venlafaxine hcl) .Marland Kitchen... 1 by mouth at bedtime  transitioned to effexor (generic) and cont behav therapy - reassurance provided  Orders: Prescription Created Electronically [phone])  Problem # 2:  VENTRAL HERNIA (ICD-553.20)  refer to surg as prev requested - pt specifically requests dr. gross  Orders: Surgical Referral (Surgery)  Complete Medication List: 1)  Nexium 40 Mg Cpdr (Esomeprazole magnesium) .... Take 1 tablet by mouth once a day 2)  Allegra 180 Mg Tabs (Fexofenadine hcl) .... Take one (1) tablet every morning 3)  Venlafaxine Hcl 150 Mg Xr24h-tab (Venlafaxine hcl) .Marland Kitchen.. 1 by mouth at bedtime 4)  Hydrochlorothiazide 25 Mg Tabs (Hydrochlorothiazide) .... Take one (1) tablet every morning 5)  Truetest Test Strp (Glucose blood) .... Dx 250.02 check blood sugar twice daily 6)  At Last Lancets Misc (Lancets) .... To use with true track meter check blood sugar two times a day 7)  Levothyroxine Sodium 125 Mcg Tabs (Levothyroxine sodium) .... 2 qd 8)  Hydrocodone-acetaminophen 7.5-325 Mg Tabs (Hydrocodone-acetaminophen) .... Take 1 every 6 hours as needed only for severe pain 9)  Flexeril 10 Mg Tabs (Cyclobenzaprine hcl) .... Take 1 by mouth qd 10)  Diclofenac Sodium 75 Mg Tbec (Diclofenac sodium) .... Take 1 two times a day 11)  Strattera 40 Mg Caps (Atomoxetine hcl) .Marland Kitchen.. 1 by mouth once daily 12)  Nyquil  60-7.09-02-998 Mg/40ml Liqd (Pseudoeph-doxylamine-dm-apap) .... Use as needed  Patient Instructions: 1)  it was good to see you today.  2)  increase venlafaxine as discussed - your prescription has been electronically submitted to your pharmacy. Please take as directed. Contact our office if you believe you're having problems with the medication(s).  3)  we'll make referral to  dr. gross for your hernia. Our office will contact you regarding this appointment once made.  4)  Please schedule a follow-up appointment in 3 months to review medication and anxiety symptoms, sooner if problems.  Prescriptions: VENLAFAXINE HCL 150 MG XR24H-TAB (VENLAFAXINE HCL) 1 by mouth at bedtime  #30 x 2   Entered and Authorized by:   Newt Lukes MD   Signed by:   Newt Lukes MD on 10/18/2009   Method used:   Electronically to        [facility] [address] [phone]* (  retail)       [address]       [place], [place]  [address]       Ph: [phone] or [phone]       Fax: [phone]   RxID:   [id number]

## 2010-05-06 NOTE — Letter (Signed)
Summary: [facility]  [facility]   Imported By: Lester [place] 04/30/2009 13:54:05  _____________________________________________________________________  External Attachment:    Type:   Image     Comment:   External Document

## 2010-05-06 NOTE — Letter (Signed)
Summary: [facility]  [facility]   Imported By: Sherian Rein 05/04/2009 10:07:02  _____________________________________________________________________  External Attachment:    Type:   Image     Comment:   External Document

## 2010-05-06 NOTE — Medication Information (Signed)
Summary: Ch[facility]  [facility]   Imported By: Sherian Rein 05/15/2009 13:11:00  _____________________________________________________________________  External Attachment:    Type:   Image     Comment:   External Document

## 2010-05-06 NOTE — Assessment & Plan Note (Signed)
Summary: 3 MO ROV /NWS   Vital Signs:  Patient profile:   45 year old female Height:      67.5 inches (171.45 cm) Weight:      337.4 pounds (153.36 kg) O2 Sat:      93 % on Room air Temp:     98.7 degrees F (37.06 degrees C) oral Pulse rate:   89 / minute BP sitting:   124 / 82  (left arm) Cuff size:   large  Vitals Entered By: Orlan Leavens RMA (January 28, 2010 10:52 AM)  O2 Flow:  Room air  Primary Care Kelaiah Escalona:  Newt Lukes MD   History of Present Illness: here for f/u  1) DM2 - follows with dr. Everardo All - currently diet controlled because not taking metformin reports sugars have been "ok" - running 120-140 - denies hypoglycemia events, no siugar >200  2) ovarian cancer - s/p surg and chemo 2009 follows with dr. 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 Charlett Blake) several times in pat 3 mos for same - on voltaren + vicodin - sometimes none, never more than 4/d expresses understanding that her weight is contributing to the pain  4) HTN - no recent med changes no adv SE of meds  5) depression/anxiety long hx same (started age 30s) - on meds for same feels symptoms controlled with current tx -  also on strattera with better results counseling started summer 2011 -doing well with this occ unable to get out of the house because of panic intol of prozac, seroquel, and zoloft; trazadone, celexa and effexor-  infreq therapy visits b/c "hard to go when i can't leave the house" - ready to retry zoloft  6) morbid obesity -  weight loss and ongoing diet efforts reviewed - s/p eval by nutritionist  7) hypothyroid - reports compliance with ongoing medical treatment and no changes in medication dose or frequency. denies adverse side effects related to current therapy.   Clinical Review Panels:  Lipid Management   Cholesterol:  148  (06/01/2007)   LDL (bad choesterol):  96 (06/01/2007)   HDL (good cholesterol):  20 (06/01/2007)  Diabetes Management   HgBA1C:  6.3 (08/09/2009)   Creatinine:  1.11 (07/12/2009)  CBC   WBC:  10.0 (07/12/2009)   RBC:  4.80 (03/13/2008)   Hgb:  16.5 (07/12/2009)   Hct:  43.5 (03/13/2008)   Platelets:  264 (07/12/2009)   MCV  90.6 (03/13/2008)   MCHC  30.6 (03/13/2008)   RDW  23.0 (03/13/2008)   PMN:  58 (03/13/2008)   Lymphs:  28 (03/13/2008)   Monos:  12 (03/13/2008)   Eosinophils:  1 (03/13/2008)   Basophil:  1 (03/13/2008)  Complete Metabolic Panel   Glucose:  120 (07/12/2009)   Sodium:  138 (07/12/2009)   Potassium:  4.5 (07/12/2009)   Chloride:  105 (07/12/2009)   CO2:  20 (07/12/2009)   BUN:  15 (07/12/2009)   Creatinine:  1.11 (07/12/2009)   Albumin:  4.0 (07/12/2009)   Total Protein:  6.4 (03/13/2008)   Calcium:  8.6 (07/12/2009)   Total Bili:  0.5 (07/12/2009)   Alk Phos:  172 (07/12/2009)   SGPT (ALT):  29 (07/12/2009)   SGOT (AST):  29 (07/12/2009)   Current Medications (verified): 1)  Nexium 40 Mg  Cpdr (Esomeprazole Magnesium) .... Take 1 Tablet By Mouth  Once A Day 2)  Allegra 180 Mg Tabs (Fexofenadine Hcl) .... Take One (1) Tablet Every Morning 3)  Venlafaxine Hcl 150 Mg Xr24h-Tab (Venlafaxine Hcl) .Marland Kitchen.. 1 By Mouth At Bedtime 4)  Hydrochlorothiazide 25 Mg Tabs (Hydrochlorothiazide) .... Take One (1) Tablet Every Morning 5)  Truetest Test   Strp (Glucose Blood) .... Dx 250.02 Check Blood Sugar Twice Daily 6)  At Last Lancets   Misc (Lancets) .... To Use With True Track Meter Check Blood Sugar Two Times A Day 7)  Levothyroxine Sodium 125 Mcg Tabs (Levothyroxine Sodium) .... 2 Qd 8)  Hydrocodone-Acetaminophen 7.5-325 Mg Tabs (Hydrocodone-Acetaminophen) .... Take 1 Every 6 Hours As Needed Only For Severe Pain 9)  Flexeril 10 Mg Tabs (Cyclobenzaprine Hcl) .... Take 1 By Mouth Qd 10)  Diclofenac Sodium 75 Mg Tbec (Diclofenac Sodium) .... Take 1 Two Times A  Day 11)  Strattera 40 Mg Caps (Atomoxetine Hcl) .Marland Kitchen.. 1 By Mouth Once Daily  Allergies (verified): 1)  ! Sulfa  Past History:  Past Medical History: DIABETES MELLITUS, TYPE II, UNCONTROLLED  ADENOCARCINOMA, OVARY, LEFT  VENTRAL HERNIA  BACK PAIN, chronic   osteoarthritis, knees- L>R DEPRESSION/ANXIETY ADHD HEPATITIS C  OBESITY, MORBID HYPOTHYROIDISM HYPERTENSION   ALLERGIC RHINITIS  MD roster -  onc - livesay gyn onc -brewster ([facility]) endo - [facility]  ortho - [facility] psyc - jean peters  Review of Systems  The patient denies anorexia, weight gain, chest pain, and headaches.    Physical Exam  General:  morbidly obese.  alert, well-developed, well-nourished, and cooperative to examination.  Lungs:  normal respiratory effort, no intercostal retractions or use of accessory muscles; normal breath sounds bilaterally - no crackles and no wheezes.    Heart:  normal rate, regular rhythm, no murmur, and no rub. BLE without edema.  Psych:  Oriented X3, memory intact for recent and remote, normally interactive, good eye contact, not anxious appearing, not depressed appearing, and not agitated.      Impression & Recommendations:  Problem # 1:  GENERALIZED ANXIETY DISORDER (ICD-300.02)  Her updated medication list for this problem includes:    Sertraline Hcl 25 Mg Tabs (Sertraline hcl) .Marland Kitchen... 1 by mouth once daily  Orders: Prescription Created Electronically [phone])  increase effexor dose caused sedation so pt stopped med wants to retry zoloft for anxiety - new erx done discussed need for slow titration to help minimize intol and SE - pt will call after 4-6 weeks for inc med dose if still taking same reminded of importance of counseling for anxiety therpay - will f/u for same  Problem # 2:  OBESITY, MORBID (ICD-278.01)  weight loss reviewed including her obsticles to caring for self - pt expressed understanding of need for life style changes -  hopefully changing tx for ADHD  and depression will help - Ht: 67.5 (05/07/2009)   Wt: 352.8 (05/07/2009)   BMI: 53.12 (10/16/2008)  Ht: 67.5 (08/09/2009)   Wt: 352.0 (08/09/2009)   BMI: 54.51 (08/09/2009)  Ht: 67.5 (01/28/2010)   Wt: 337.4 (01/28/2010)     Problem # 3:  DM (ICD-250.00)  Orders: TLB-A1C / Hgb A1C (Glycohemoglobin) (83036-A1C)  diet only at present as intol of metformin (vomitting) reck labs now, refer to nutrtionist  Labs Reviewed: Creat: 1.11 (07/12/2009)    Reviewed HgBA1c results: 6.3 (08/09/2009)  6.4 (05/07/2009)  Problem # 4:  HYPOTHYROIDISM (ICD-244.9)  Her updated medication list for this problem includes:    Levothyroxine Sodium 125 Mcg Tabs (Levothyroxine sodium) .Marland Kitchen... 2 by mouth once  daily  Orders: TLB-TSH (Thyroid Stimulating Hormone) (84443-TSH)  Labs Reviewed: TSH: 4.55 (08/09/2009)    HgBA1c: 6.3 (08/09/2009) Chol: 148 (06/01/2007)   HDL: 20 (06/01/2007)   LDL: 96 (06/01/2007)   TG: 160 (06/01/2007)  Problem # 5:  HYPERTENSION (ICD-401.9)  Her updated medication list for this problem includes:    Hydrochlorothiazide 25 Mg Tabs (Hydrochlorothiazide) .Marland Kitchen... 1 by mouth once daily (or as directed)  BP today: 124/82 Prior BP: 124/82 (10/18/2009)  Labs Reviewed: K+: 4.5 (07/12/2009) Creat: : 1.11 (07/12/2009)   Chol: 148 (06/01/2007)   HDL: 20 (06/01/2007)   LDL: 96 (06/01/2007)   TG: 160 (06/01/2007)  Complete Medication List: 1)  Nexium 40 Mg Cpdr (Esomeprazole magnesium) .... Take 1 tablet by mouth once a day 2)  Allegra 180 Mg Tabs (Fexofenadine hcl) .... Take one (1) tablet every morning 3)  Hydrochlorothiazide 25 Mg Tabs (Hydrochlorothiazide) .Marland Kitchen.. 1 by mouth once daily (or as directed) 4)  Truetest Test Strp (Glucose blood) .... Dx 250.02 check blood sugar twice daily 5)  At Last Lancets Misc (Lancets) .... To use with true track meter check blood sugar two times a day 6)  Levothyroxine Sodium 125 Mcg Tabs (Levothyroxine sodium) .... 2 by mouth once daily 7)   Hydrocodone-acetaminophen 7.5-325 Mg Tabs (Hydrocodone-acetaminophen) .... Take 1 every 6 hours as needed only for severe pain 8)  Flexeril 10 Mg Tabs (Cyclobenzaprine hcl) .... Take 1 by mouth once daily as needed 9)  Diclofenac Sodium 75 Mg Tbec (Diclofenac sodium) .... Take 1 two times a day 10)  Strattera 40 Mg Caps (Atomoxetine hcl) .Marland Kitchen.. 1 by mouth once daily 11)  Sertraline Hcl 25 Mg Tabs (Sertraline hcl) .Marland Kitchen.. 1 by mouth once daily  Patient Instructions: 1)  it was good to see you today.  2)  start low dose sertraline for anxiety as discussed - your prescription has been electronically submitted to your pharmacy. Please take as directed. Contact our office if you believe you're having problems with the medication(s).  3)  when you are ready to increase dose sertraline, call us and we will send new prescription for next dose (50mg ) 4)  test(s) ordered today - your results will be posted on the phone tree for review in 48-72 hours from the time of test completion; call [phone] and enter your 9 digit MRN (listed above on this page, just below your name); if any changes need to be made or there are abnormal results, you will be contacted directly.  5)  Please schedule a follow-up appointment in 3 months to review medication and anxiety symptoms, sooner if problems.  6)  call and resume visits with dr. Noe Gens as discussed 7)  good luck with continued weight loss efforts! you are doing well! Prescriptions: HYDROCHLOROTHIAZIDE 25 MG TABS (HYDROCHLOROTHIAZIDE) 1 by mouth once daily (or as directed)  #40 x 6   Entered and Authorized by:   Newt Lukes MD   Signed by:   Newt Lukes MD on 01/28/2010   Method used:   Electronically to        [facility] [address] [phone]* (retail)       [address]       [place], [place]  [address]       Ph: [phone] or [phone]       Fax: [phone]   RxID:   [id number] SERTRALINE HCL 25 MG TABS (SERTRALINE HCL)  1 by mouth once daily  #  30 x 3   Entered and Authorized by:   Newt Lukes MD   Signed by:   Newt Lukes MD on 01/28/2010   Method used:   Electronically to        [facility] [address] [phone]* (retail)       [address]       [place], [place]  [address]       Ph: [phone] or [phone]       Fax: [phone]   RxID:   [id number]    Orders Added: 1)  Est. Patient Level IV [42595] 2)  TLB-TSH (Thyroid Stimulating Hormone) [84443-TSH] 3)  TLB-A1C / Hgb A1C (Glycohemoglobin) [83036-A1C] 4)  Prescription Created Electronically [phone]

## 2010-05-06 NOTE — Progress Notes (Signed)
Summary: Nexium PA  Phone Note From Pharmacy   Summary of Call: PA request--Nexium. Has the patient tried and failed Omeprazole? Ok to initiate prior authorization? Initial call taken by: Lucious Groves,  September 17, 2009 11:57 AM  Follow-up for Phone Call        yes Newt Lukes MD  September 17, 2009 1:21 PM   Additional Follow-up for Phone Call Additional follow up Details #1::        PA was available via [email or web address], completed, and approved until 2012. Additional Follow-up by: Lucious Groves,  September 18, 2009 10:51 AM

## 2010-05-06 NOTE — Progress Notes (Signed)
Summary: Bariatric surgery  Phone Note Call from Patient Call back at Home Phone [phone]   Caller: Patient Summary of Call: pt called stating that Bariatric surgery seminar sent her a confirmation e-mail but it said that her insurance,  Medicare and Medicare is not accepted. pt says that MD would try and get her Insurance to cover due to her health problems, please advise. pt is aware that MD is out of office until tomorrow. Initial call taken by: Margaret Pyle, CMA,  May 08, 2009 3:46 PM  Follow-up for Phone Call        noted, thank you Follow-up by: Minus Breeding MD,  May 08, 2009 6:31 PM     Appended Document: Bariatric surgery pt called stating that MD said he would "[employer]" to make sure they will pay for surgery. please advise  Appended Document: Bariatric surgery sorry, but i cannot overcome the medicaid problem.  Appended Document: Bariatric surgery pt informed and will contact surgeon's office to get more information on anything else she may be able to do.

## 2010-05-06 NOTE — Progress Notes (Signed)
Summary: ABX?  Phone Note Call from Patient Call back at [facility] Phone [phone]   Caller: Patient Summary of Call: Pt called stating that she is not any better after using ABX. Pt is requesting Rx for a different, stronger ABX to [facility]. Please advise, Rx or OV? Initial call taken by: Margaret Pyle, CMA,  September 30, 2009 1:23 PM  Follow-up for Phone Call        levaquin 500mg  once daily x 7 day - erx done - if still not better, will need OV to eval cause of symptoms  Follow-up by: Newt Lukes MD,  September 30, 2009 3:00 PM  Additional Follow-up for Phone Call Additional follow up Details #1::        pt informed Additional Follow-up by: Margaret Pyle, CMA,  September 30, 2009 3:29 PM    New/Updated Medications: LEVAQUIN 500 MG TABS (LEVOFLOXACIN) 1 by mouth once daily x 7 days Prescriptions: LEVAQUIN 500 MG TABS (LEVOFLOXACIN) 1 by mouth once daily x 7 days  #7 x 0   Entered and Authorized by:   Newt Lukes MD   Signed by:   Newt Lukes MD on 09/30/2009   Method used:   Electronically to        [facility] [address] [phone]* (retail)       [address]       [place], [place]  [address]       Ph: [phone] or [phone]       Fax: [phone]   RxID:   [id number]

## 2010-05-06 NOTE — Letter (Signed)
[facility]   Imported By: Sherian Rein 07/31/2009 07:35:23  _____________________________________________________________________  External Attachment:    Type:   Image     Comment:   External Document

## 2010-05-06 NOTE — Progress Notes (Signed)
Summary: nexium  Phone Note Refill Request Message from:  Fax from Pharmacy on June 11, 2009 4:18 PM  Refills Requested: Medication #1:  NEXIUM 40 MG  CPDR Take 1 tablet by mouth once a day  Method Requested: Electronic Initial call taken by: Orlan Leavens,  June 11, 2009 4:18 PM    Prescriptions: NEXIUM 40 MG  CPDR (ESOMEPRAZOLE MAGNESIUM) Take 1 tablet by mouth once a day  #30 x 6   Entered by:   Orlan Leavens   Authorized by:   Newt Lukes MD   Signed by:   Orlan Leavens on 06/11/2009   Method used:   Electronically to        [facility] [address] [phone]* (retail)       [address]       [place], [place]  [address]       Ph: [phone] or [phone]       Fax: [phone]   RxID:   [id number]

## 2010-05-06 NOTE — Letter (Signed)
Summary: [facility]   Imported By: Lennie Odor 12/13/2009 16:25:05  _____________________________________________________________________  External Attachment:    Type:   Image     Comment:   External Document

## 2010-05-06 NOTE — Assessment & Plan Note (Signed)
Summary: 3 mth fu  stc   Vital Signs:  Patient profile:   45 year old female Height:      67.5 inches (171.45 cm) Weight:      348.8 pounds (158.55 kg) O2 Sat:      97 % on Room air Temp:     97.4 degrees F (36.33 degrees C) oral Pulse rate:   83 / minute BP sitting:   118 / 80  (left arm) Cuff size:   large  Vitals Entered By: Orlan Leavens (September 17, 2009 1:50 PM)  O2 Flow:  Room air CC: 3 month follow-up/ Also c/o of cough, coughing up phlegm Is Patient Diabetic? Yes Did you bring your meter with you today? No Pain Assessment Patient in pain? no        Primary Care Provider:  Newt Lukes MD  CC:  3 month follow-up/ Also c/o of cough and coughing up phlegm.  History of Present Illness: c/o  increase in anxiety - unable to get out of the house because of panic intol of prozac, seroquel, and zoloft; trazadone and celexa not helping in therapy but "hard to go when i can;t leave the house"   prior note reviewed from 08/2009 here: 1) DM2 - follows with dr. Everardo All - currently diet controlled because not taking metformin reports sugars have been "ok" - denies hypoglycemia events  2) ovarian cancer - s/p surg and chemo 2009 follows with dr. 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 Charlett Blake) several times in pat 3 mos for same - on voltaren + vicodin - sometimes none, never more than 4/d expresses understanding that her weight is contributing to the pain  4) HTN - no recent med changes no adv SE of meds  5) depression/anxiety long hx same (started age 16s) - on meds for same feels symptoms not controlled with current tx -  also on strattera with better results counseling started since last OV -doing well with this  6) morbid obesity -  weight gain reviewed - pt easily acknolwdges the causes for it and assoc problems of  ortho pains would like to see nutritionist  Clinical Review Panels:  Lipid Management   Cholesterol:  148 (06/01/2007)   LDL (bad choesterol):  96 (06/01/2007)   HDL (good cholesterol):  20 (06/01/2007)  Diabetes Management   HgBA1C:  6.3 (08/09/2009)   Creatinine:  1.11 (07/12/2009)  CBC   WBC:  10.0 (07/12/2009)   RBC:  4.80 (03/13/2008)   Hgb:  16.5 (07/12/2009)   Hct:  43.5 (03/13/2008)   Platelets:  264 (07/12/2009)   MCV  90.6 (03/13/2008)   MCHC  30.6 (03/13/2008)   RDW  23.0 (03/13/2008)   PMN:  58 (03/13/2008)   Lymphs:  28 (03/13/2008)   Monos:  12 (03/13/2008)   Eosinophils:  1 (03/13/2008)   Basophil:  1 (03/13/2008)  Complete Metabolic Panel   Glucose:  120 (07/12/2009)   Sodium:  138 (07/12/2009)   Potassium:  4.5 (07/12/2009)   Chloride:  105 (07/12/2009)   CO2:  20 (07/12/2009)   BUN:  15 (07/12/2009)   Creatinine:  1.11 (07/12/2009)   Albumin:  4.0 (07/12/2009)   Total Protein:  6.4 (03/13/2008)   Calcium:  8.6 (07/12/2009)   Total Bili:  0.5 (07/12/2009)   Alk Phos:  172 (07/12/2009)  SGPT (ALT):  29 (07/12/2009)   SGOT (AST):  29 (07/12/2009)   Current Medications (verified): 1)  Nexium 40 Mg  Cpdr (Esomeprazole Magnesium) .... Take 1 Tablet By Mouth Once A Day 2)  Allegra 180 Mg Tabs (Fexofenadine Hcl) .... Take One (1) Tablet Every Morning 3)  Celexa 40 Mg Tabs (Citalopram Hydrobromide) .... Take One (1) Tablet Every Morning 4)  Hydrochlorothiazide 25 Mg Tabs (Hydrochlorothiazide) .... Take One (1) Tablet Every Morning 5)  Trazodone Hcl 50 Mg  Tabs (Trazodone Hcl) .Marland Kitchen.. 1 Tablet By Mouth Daily As Needed For Anxiety 6)  Truetest Test   Strp (Glucose Blood) .... Dx 250.02 Check Blood Sugar Twice Daily 7)  At Last Lancets   Misc (Lancets) .... To Use With True Track Meter Check Blood Sugar Two Times A Day 8)  Levothyroxine Sodium 125 Mcg Tabs (Levothyroxine Sodium) .... 2 Qd 9)  Hydrocodone-Acetaminophen 7.5-325 Mg Tabs  (Hydrocodone-Acetaminophen) .... Take 1 Every 6 Hours As Needed Only For Severe Pain 10)  Flexeril 10 Mg Tabs (Cyclobenzaprine Hcl) .... Take 1 By Mouth Qd 11)  Diclofenac Sodium 75 Mg Tbec (Diclofenac Sodium) .... Take 1 Two Times A Day 12)  Strattera 40 Mg Caps (Atomoxetine Hcl) .Marland Kitchen.. 1 By Mouth Once Daily 13)  Nyquil 60-7.09-02-998 Mg/61ml Liqd (Pseudoeph-Doxylamine-Dm-Apap) .... Use As Needed  Allergies (verified): 1)  ! Sulfa  Past History:  Past Medical History: DIABETES MELLITUS, TYPE II, UNCONTROLLED  ADENOCARCINOMA, OVARY, LEFT  VENTRAL HERNIA  BACK PAIN, chronic   osteoarthritis, knees- L>R DEPRESSION/ANXIETY ADHD HEPATITIS C  OBESITY, MORBID HYPOTHYROIDISM HYPERTENSION   ALLERGIC RHINITIS  MD roster - onc - livesay gyn onc -brewster ([facility]) endo - [facility] ortho - [facility] psyc - jean peters  Review of Systems  The patient denies anorexia, fever, chest pain, and headaches.    Physical Exam  General:  morbidly obese.  alert, well-developed, well-nourished, and cooperative to examination.  Lungs:  normal respiratory effort, no intercostal retractions or use of accessory muscles; normal breath sounds bilaterally - no crackles and no wheezes.    Heart:  normal rate, regular rhythm, no murmur, and no rub. BLE without edema.    Impression & Recommendations:  Problem # 1:  GENERALIZED ANXIETY DISORDER (ICD-300.02)  transition celexa to effexor (generic) and cont behav therapy -  Time spent with patient 25 minutes, more than 50% of this time was spent counseling patient on anxiety meds and need for cont counseling close f/u 4-6 weeks to review The following medications were removed from the medication list:    Trazodone Hcl 50 Mg Tabs (Trazodone hcl) .Marland Kitchen... 1 tablet by mouth daily as needed for anxiety Her updated medication list for this problem includes:    Venlafaxine Hcl 50 Mg Tabs (Venlafaxine hcl) .Marland Kitchen... 1 by mouth once daily x 7 days then by mouth two times a  day  (or as directed)  Orders: Prescription Created Electronically [phone])  Problem # 2:  DEPRESSION (ICD-311) see above The following medications were removed from the medication list:    Trazodone Hcl 50 Mg Tabs (Trazodone hcl) .Marland Kitchen... 1 tablet by mouth daily as needed for anxiety Her updated medication list for this problem includes:    Venlafaxine Hcl 50 Mg Tabs (Venlafaxine hcl) .Marland Kitchen... 1 by mouth once daily x 7 days then by mouth two times a day  (or as directed)  Complete Medication List: 1)  Nexium 40 Mg Cpdr (Esomeprazole magnesium) .... Take 1 tablet by mouth once a day 2)  Allegra 180  Mg Tabs (Fexofenadine hcl) .... Take one (1) tablet every morning 3)  Venlafaxine Hcl 50 Mg Tabs (Venlafaxine hcl) .Marland Kitchen.. 1 by mouth once daily x 7 days then by mouth two times a day  (or as directed) 4)  Hydrochlorothiazide 25 Mg Tabs (Hydrochlorothiazide) .... Take one (1) tablet every morning 5)  Truetest Test Strp (Glucose blood) .... Dx 250.02 check blood sugar twice daily 6)  At Last Lancets Misc (Lancets) .... To use with true track meter check blood sugar two times a day 7)  Levothyroxine Sodium 125 Mcg Tabs (Levothyroxine sodium) .... 2 qd 8)  Hydrocodone-acetaminophen 7.5-325 Mg Tabs (Hydrocodone-acetaminophen) .... Take 1 every 6 hours as needed only for severe pain 9)  Flexeril 10 Mg Tabs (Cyclobenzaprine hcl) .... Take 1 by mouth qd 10)  Diclofenac Sodium 75 Mg Tbec (Diclofenac sodium) .... Take 1 two times a day 11)  Strattera 40 Mg Caps (Atomoxetine hcl) .Marland Kitchen.. 1 by mouth once daily 12)  Nyquil 60-7.09-02-998 Mg/40ml Liqd (Pseudoeph-doxylamine-dm-apap) .... Use as needed  Patient Instructions: 1)  it was good to see you today.  2)  take 1/2 celexa tab daily for 6 days then stop - 3)  stop trazadone now- 4)  start venlafaxine as discussed - your prescription has been electronically submitted to your pharmacy. Please take as directed. Contact our office if you believe you're having problems  with the medication(s).  5)  Please schedule a follow-up appointment in 4-6 weeks to review medication and anxiety symptoms, sooner if problems.  Prescriptions: VENLAFAXINE HCL 50 MG TABS (VENLAFAXINE HCL) 1 by mouth once daily x 7 days then by mouth two times a day  (or as directed)  #60 x 1   Entered and Authorized by:   Newt Lukes MD   Signed by:   Newt Lukes MD on 09/17/2009   Method used:   Electronically to        [facility] [address] [phone]* (retail)       [address]       [place], [place]  [address]       Ph: [phone] or [phone]       Fax: [phone]   RxID:   [id number]

## 2010-05-06 NOTE — Medication Information (Signed)
Summary: Approved/[facility]co  Approved/[facility]   Imported By: Lester Ogle 05/21/2009 10:50:16  _____________________________________________________________________  External Attachment:    Type:   Image     Comment:   External Document

## 2010-05-06 NOTE — Letter (Signed)
Summary: Consult/[facility]  Consult/[facility]   Imported By: Sherian Rein 11/12/2009 10:31:34  _____________________________________________________________________  External Attachment:    Type:   Image     Comment:   External Document

## 2010-05-06 NOTE — Progress Notes (Signed)
Summary: pt request  Phone Note Call from Patient Call back at Home Phone [phone]   Caller: Patient Summary of Call: pt called requesting a Rx for Chantix Initial call taken by: Margaret Pyle, CMA,  May 08, 2009 1:48 PM  Follow-up for Phone Call        ok - may send e-rx (starter pack, no refills) Follow-up by: Newt Lukes MD,  May 08, 2009 2:10 PM  Additional Follow-up for Phone Call Additional follow up Details #1::        e-rx done - pls let pt know same - thx Additional Follow-up by: Newt Lukes MD,  May 08, 2009 3:29 PM    Additional Follow-up for Phone Call Additional follow up Details #2::    pt informed Follow-up by: Margaret Pyle, CMA,  May 08, 2009 3:44 PM  New/Updated Medications: CHANTIX STARTING MONTH PAK 0.5 MG X 11 & 1 MG X 42 TABS (VARENICLINE TARTRATE) as directed Prescriptions: CHANTIX STARTING MONTH PAK 0.5 MG X 11 & 1 MG X 42 TABS (VARENICLINE TARTRATE) as directed  #1 x 0   Entered and Authorized by:   Newt Lukes MD   Signed by:   Newt Lukes MD on 05/08/2009   Method used:   Electronically to        [facility] [address] [phone]* (retail)       [address]       [place], [place]  [address]       Ph: [phone] or [phone]       Fax: [phone]   RxID:   [id number]

## 2010-05-13 ENCOUNTER — Encounter: Payer: Self-pay | Admitting: Internal Medicine

## 2010-05-13 ENCOUNTER — Ambulatory Visit: Payer: Self-pay | Admitting: Internal Medicine

## 2010-05-22 NOTE — Letter (Signed)
Summary: CMN for Diabetes Supplies/[facility]  CMN for Diabetes Supplies/[facility]   Imported By: Sherian Rein 05/16/2010 07:40:57  _____________________________________________________________________  External Attachment:    Type:   Image     Comment:   External Document

## 2010-05-27 ENCOUNTER — Encounter (HOSPITAL_COMMUNITY): Payer: Medicare Other

## 2010-05-27 ENCOUNTER — Other Ambulatory Visit: Payer: Self-pay | Admitting: Surgery

## 2010-05-27 DIAGNOSIS — Z01812 Encounter for preprocedural laboratory examination: Secondary | ICD-10-CM | POA: Insufficient documentation

## 2010-05-27 LAB — BASIC METABOLIC PANEL
BUN: 12 mg/dL (ref 6–23)
Calcium: 9.2 mg/dL (ref 8.4–10.5)
GFR calc non Af Amer: >60 mL/min (ref >60)
Glucose, Bld: 99 mg/dL (ref 70–99)
Sodium: 137 mEq/L (ref 135–145)

## 2010-05-27 LAB — CBC
HCT: 49.8 % — ABNORMAL HIGH (ref 36.0–46.0)
MCHC: 33.5 g/dL (ref 30.0–36.0)
MCV: 92.9 fL (ref 78.0–100.0)
RDW: 13.2 % (ref 11.5–15.5)

## 2010-05-27 LAB — SURGICAL PCR SCREEN: MRSA, PCR: NEGATIVE

## 2010-05-29 ENCOUNTER — Other Ambulatory Visit: Payer: Self-pay | Admitting: Surgery

## 2010-05-30 ENCOUNTER — Observation Stay (HOSPITAL_COMMUNITY)
Admission: RE | Admit: 2010-05-30 | Discharge: 2010-06-01 | Disposition: A | Discharge status: Home / Self Care | Payer: Medicare Other | Source: Ambulatory Visit | Attending: Surgery | Admitting: Surgery

## 2010-05-30 DIAGNOSIS — D494 Neoplasm of unspecified behavior of bladder: Secondary | ICD-10-CM | POA: Insufficient documentation

## 2010-05-30 DIAGNOSIS — K430 Incisional hernia with obstruction, without gangrene: Principal | ICD-10-CM | POA: Insufficient documentation

## 2010-05-30 DIAGNOSIS — Z9079 Acquired absence of other genital organ(s): Secondary | ICD-10-CM | POA: Insufficient documentation

## 2010-05-30 DIAGNOSIS — F17200 Nicotine dependence, unspecified, uncomplicated: Secondary | ICD-10-CM | POA: Insufficient documentation

## 2010-05-30 DIAGNOSIS — I10 Essential (primary) hypertension: Secondary | ICD-10-CM | POA: Insufficient documentation

## 2010-05-30 DIAGNOSIS — E039 Hypothyroidism, unspecified: Secondary | ICD-10-CM | POA: Insufficient documentation

## 2010-05-30 DIAGNOSIS — K219 Gastro-esophageal reflux disease without esophagitis: Secondary | ICD-10-CM | POA: Insufficient documentation

## 2010-05-30 DIAGNOSIS — Z79899 Other long term (current) drug therapy: Secondary | ICD-10-CM | POA: Insufficient documentation

## 2010-05-30 DIAGNOSIS — Z90710 Acquired absence of both cervix and uterus: Secondary | ICD-10-CM | POA: Insufficient documentation

## 2010-05-30 DIAGNOSIS — IMO0002 Reserved for concepts with insufficient information to code with codable children: Secondary | ICD-10-CM | POA: Insufficient documentation

## 2010-05-30 HISTORY — PX: VENTRAL HERNIA REPAIR: SHX424

## 2010-05-30 LAB — POTASSIUM: Potassium: 2.9 mEq/L — ABNORMAL LOW (ref 3.5–5.1)

## 2010-05-30 LAB — GLUCOSE, CAPILLARY: Glucose-Capillary: 147 mg/dL — ABNORMAL HIGH (ref 70–99)

## 2010-05-31 LAB — BASIC METABOLIC PANEL
BUN: 7 mg/dL (ref 6–23)
CO2: 32 mEq/L (ref 19–32)
Chloride: 97 mEq/L (ref 96–112)
Creatinine, Ser: 1.04 mg/dL (ref 0.4–1.2)
Glucose, Bld: 143 mg/dL — ABNORMAL HIGH (ref 70–99)
Potassium: 3.7 mEq/L (ref 3.5–5.1)

## 2010-05-31 LAB — CBC
MCHC: 31.7 g/dL (ref 30.0–36.0)
Platelets: 287 10*3/uL (ref 150–400)
RDW: 13.6 % (ref 11.5–15.5)
WBC: 12.4 10*3/uL — ABNORMAL HIGH (ref 4.0–10.5)

## 2010-06-02 ENCOUNTER — Encounter: Payer: Self-pay | Admitting: Internal Medicine

## 2010-06-11 ENCOUNTER — Ambulatory Visit: Payer: Self-pay | Admitting: Internal Medicine

## 2010-06-12 NOTE — Letter (Signed)
Summary: Diabetes Supplies/[facility]  Diabetes Supplies/[facility]   Imported By: Sherian Rein 06/04/2010 10:13:31  _____________________________________________________________________  External Attachment:    Type:   Image     Comment:   External Document

## 2010-06-13 ENCOUNTER — Telehealth: Payer: Self-pay | Admitting: Internal Medicine

## 2010-06-16 NOTE — Op Note (Signed)
[name], [name]               ACCOUNT NO.:  [id number]  MEDICAL RECORD NO.:  [id number]           PATIENT TYPE:  O  LOCATION:  PADM                         FACILITY:  [facility]  PHYSICIAN:  [name] Sportsman, MD     DATE OF BIRTH:  08-Mar-1966  DATE OF PROCEDURE:  05/30/2010 DATE OF DISCHARGE:                              OPERATIVE REPORT   PRIMARY CARE PHYSICIAN:  [name] A. Felicity Coyer, MD  PREOPERATIVE DIAGNOSIS:  Ventral incisional hernia.  POSTOPERATIVE DIAGNOSES: 1. Ventral hernia x3, incarcerated. 2. Chronic thinning out of skin, ventral hernia. 3. Mass on dome of bladder, (? Endometrium), 5x5x3 mm.  PROCEDURES PERFORMED: 1. Laparoscopic lysis of adhesions x2 hours. 2. Primary repair of periumbilical ventral hernia with excision of     thinned down and mildly ulcerated skin and dilated hernia sac. 3. Serosal repair on small intestine. 4. Laparoscopic ventral hernia repair with 25 x 35 cm dual-side mesh     (Physiomesh = Ultrapro/Monocryl dual-side mesh). 5. Excisonal biopsy of bladder dome peritoneal mass.  SPECIMENS:  Bladder dome mass for biopsy.  DRAINS:  A #15-French drain goes in the left upper quadrant into the subcutaneous tissues at the form of hernia sac.  ESTIMATED BLOOD LOSS:  50 mL.  COMPLICATIONS:  Not major.  INDICATIONS:  The patient is a 45 year old female morbidly obese who had prior to abdominal hysterectomy and salpingo-oophorectomy for endometrial ovarian cancer in 2008.  She developed postoperative bowel perforation and required surgery and bowel resection.  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  She eventually resolved from that and closed down in June 2010.  She was placed on post- adjuvant chemotherapy and completed in December 2010; however, she developed incisional hernia.  I saw her in August 2011.  I recommended a repair.  Due to some financial and other issues, it has been delayed.  Patient now notes that  the hernia has got larger and thins out the skin. She has had a few areas where she will get skin ulceration, but no severe drainage.  The anatomy and physiology of abdominal wall was discussed and pathophysiology of herniation with [name] incarceration, strangulation were discussed.  Because of her small bowel and small hernias with skin breakdown and thinning, I recommended to repair.  She had preoperative clearance.  Risk, benefits, alternatives were discussed of the procedure and she agreed to proceed.  OPERATIVE FINDINGS:  She had 3 ventral hernias, one was supraumbilical 3 x 3 cm in size incarcerated with a moderate volume of omentum.  Another one 8 x 9 cm that had loop of small bowel tagged into it, but not clearly incarcerated.  Another one was 6 x 8 cm in size, which had a loop of sigmoid colon incarcerated within it, but not strangulated.  She had a 5 mm brown mass on the dome of the bladder, suspicious for endometrioma.  DESCRIPTION OF PROCEDURE:  Informed consent was confirmed.  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  She was switched to supine  with her arms tucked.  She had a 3- way Foley catheter placed.  Her abdomen was prepped and draped in a sterile fashion.  Surgical time-out confirmed out plan.  I placed a #5-mm port in the left upper quadrant with the patient in steep reverse Trendelenburg and left side up using optical entry technique.  Initially, the size of the trocar was too thin, so I had up- sized to a bariatric trocar, a long 150 mm trocar.  Antrum was clean. Camera inspection revealed no intraabdominal injury.  There were not too severe adhesions in the upper abdomen.  Under direct visualization, I placed a 10 mm port in the left flank and left lower quadrant.  Later, I had to place a 5 mm port and left paramedian and another one in  the epigastric region.  I turned attention towards the camera inspection.  She had a swath of omentum going up into incarcerated supraumbilical ventral hernia.  I gradually reduced this down and trimmed off the rims and areas where the omentum was stuck into the hernia sac using cautery scissors.  I switched over to ultrasonic dissection for later dissection and avoid any more cautery at that point.  I focused attention towards the thinned down skin area with the obvious ventral hernia there, there was basically just skin only remaining. There were a couple of loops of jejunum that were tagged in there and those were easily trimmed off using cold scissors.  Most  of the adhesions were free.  Going down there, was another hernia suprapubically, this was more sort of an inverted triangular shaped with diastasis of the recti muscles.  I could see a swath of bowel going up into this.  This was carefully freed off the anterior abdominal wall, pelvic side wall, and also anterior hernia sac.  This took some time using focus blunt, cold scissors sharp dissection, eventually I could free it off.  There was a band stuck into it and I had to trim that off, but I stayed and took the hernia sac and avoid that.  Inspection revealed this was a part of redundant sigmoid. I did place a silk stitch on that area for re-inspection, but could not find any other abnormalities.  I did further dissection, she had some moderate adhesions that looped down over the pelvic rim, but eventually freed off.  She had a few interloop adhesions as well and some potential internal hernia points, I freed those off as well as some of the omentum off.  I did the reinspection of the colon, noted no injury.  Noted no serosal repairs required.  Given the fact, I did help identify the bladder.  Because the most inferior one was suprapubic, I felt that I need to have the mesh to go below the pelvic brim to have good  overlap.  We did fill up the bladder with methylene blue diluted fluid, and dilating up I could see a brown mass on the dome of the bladder, looked suspicious for endometrial, but given the ovarian cancer, I decided not  to leave alone.  I did trim it off.  The bladder seemed to be mainly in the preperitoneal fat.  I did have to go a little bit into the dome of the bladder.  I did a 3-0 Vicryl one layer closure since I do not have to go into the mucosa and only got a little bit into the muscle and bladder.  I did that with interrupted Vicryl stitches in  laparoscopic intracorporeal suturing with good result.  They sent those specimen for pathology.  Therefore, I identified the bladder, and when I got into the preperitoneal space 3 cm cephalad to the dome of the bladder, I immediately came into a nice avascular plane and freed the bladder off the anterior abdominal wall and pubis and freed of the peritoneum laterally towards the right anterior superior iliac spine and left anterior superior iliac spine.  I used Harmonic scalpel for most of this after skeletonizing free the area off.  This kind of help clear the area well.  I did measure out the defect and felt that 25 x 35 mm laparoscopic mesh would be required and, therefore, I used __________mesh.  However, the skin was extremely thinned down in the hernia sac and I did not feel it would be wise to leave a mesh just right under skin. Therefore, I did do a transverse incision on the anterior abdominal wall to excise the redundant skin that had some thinning out and ulceration on it, but no cellulitis or infection.  We then encountered a dilated hernia sac.  We then dilated herniated sac off the subcutaneous tissues and freed it up the fascial edges as well.  I did trim some excess hernia sac as well.  Inspection of the abdomen, I could see the sigmoid colon where had been an incarcerated hernia, so  I left that alone.   I did close the  hernia sac vertically using 2-0 Vicryl running stitch taking care to stay away from the bowel.  She did have a lot of Valsalva on waking up at that time, so tried to be careful.  I want to close in multiple layers, I reapproximated the largest ventral hernia transversely since it seemed to become better that way using #1 loop running PDS.  That came together well.  I did place a drain to help close the hernia sac.  There was some vesicular wound defect that extended the incision laterally and excised the extra thinned-down tissue.  I excised out the midline scar as well and somewhat healthy clean skin edges and avoided any re-inflammation or other abnormalities, so, therefore, I did have to extend the excision to about 20 cm in length in toto.  The largest aspect of the wound was 8 cm across and it did come together.  I closed that with 3-0 Vicryl interrupted deep dermal stitches and skin staples.  I did camera inspection.  I did see a loop of bowel was tacked up to the repair and I trimmed that stitch off.  I expected the bowel just a serosal involvement.  I reopened the wound, and fascia after trimming it.  I inspected small bowel and I went ahead and put 3-0 Vicryl stitches on that area as a serosal patch where the needle had gone through and tacked the small bowel.  It looked healthy and viable.  We closed the wound using some interrupted #1 Prolene fascial stitches and interrupted x3 and then closed the running tails from the side to that side for closure.  I re-closed the subcutaneous tissues and skin.    I re-inspected the abdomen and closure looked healthy and clean.  The redundant hernia sac looked viable.  I inspected small bowel and saw  no injury.  I rolled the large mesh at the abdomen and I placed #1 Prolene stitches around the superior 2/3rd of the periphery, and a couple sent in the bottom third more centrally.  I rolled  out the mesh and placed in the abdominal cavity,  secured it to the intraabdominal wall using laparoscopic suture passer due to transabdominal fashion stitches x16 around the edges.  The lower central ones that came up suprapubically. I used an absorbable tacker x2 to tack around the periphery of the mesh. Also I used that to help tack the mesh near the pubic rami periosteum,  just superior to that for a good tack.  I also did #1 Prolene transfascial stitches in the right paramedian, left paramedian, and in the midline suprapubic region to help secure the mesh in place as well to help hold it in place.  I did re-inspection of the colon and small intestine and there was a small oozer in the omentum, that I like to control with ultrasonic dissection, otherwise, hemostasis was excellent.  I did copious irrigation upon return.  I re-tacked the bladder back up using laparoscopic tacker along the periphery and avoid tacking the bladder itself, but peritoneum that helped well.  At the 10 mm port site, I reapproximated using a 0-Vicryl stitch using laparoscopic suture passer under direct visualization.  Hemostasis was good.  I used an On-Q catheter sheath and placed starting the subxiphoid region and tunnelled it in the preperitoneal space, I could see the catheters place laparoscopically both along the right costal and left costal ridges and then down towards the flank for good placement.  They came in smoothly.  I evacuated CO2 and removed the cords.  I tied the fascial stitch down.  I closed the skin using the skin staplers on the port sites.  The drain was secured using a nylon stitch.  On-Q catheter had been replaced through the sheath that I have placed laparoscopically and secured with Steri-Strips.  Patient was extubated and sent to recovery room in stable condition.  I discussed the postoperative care with the patient in my office.  I discussed again just prior to surgery.  I wrote down instructions.  Given the complexity and her  super-obesity with a BMI 52, I think she needs to stay at least overnight if not several days.     [name] Sportsman, MD     SCG/MEDQ  D:  05/30/2010  T:  05/31/2010  Job:  [id number]  cc:   Vikki Ports A. Felicity Coyer, MD [address] [place], [place] [address]  Electronically Signed by Karie Soda MD on 06/16/2010 12:14:55 PM

## 2010-06-17 NOTE — Progress Notes (Signed)
Summary: Medication Refill   Phone Note From Other Clinic Call back at [facility]   Caller: Zella Ball from [address] Summary of Call: The patient requested a refill on her hydrocodone yesterday  (06/12/2010) from [facility]. They filled but received a call from the pharmacy today 06/13/2010 to inform was filled 06/09/2010 already. [facility] then canceled the pts. refill. The office manager at [facility] requested that you be informed. Initial call taken by: Robin Ewing CMA Duncan Dull),  June 13, 2010 11:42 AM  Follow-up for Phone Call        noted - will review at upcoming ov 07/02/10 Follow-up by: Newt Lukes MD,  June 14, 2010 12:42 PM

## 2010-06-29 LAB — DIFFERENTIAL
Basophils Absolute: 0 10*3/uL (ref 0.0–0.1)
Eosinophils Relative: 2 % (ref 0–5)
Lymphocytes Relative: 30 % (ref 12–46)
Lymphs Abs: 2.7 10*3/uL (ref 0.7–4.0)
Monocytes Absolute: 0.5 10*3/uL (ref 0.1–1.0)
Neutro Abs: 5.5 10*3/uL (ref 1.7–7.7)

## 2010-06-29 LAB — COMPREHENSIVE METABOLIC PANEL
AST: 42 U/L — ABNORMAL HIGH (ref 0–37)
Albumin: 3.6 g/dL (ref 3.5–5.2)
BUN: 9 mg/dL (ref 6–23)
Calcium: 9 mg/dL (ref 8.4–10.5)
Creatinine, Ser: 1.07 mg/dL (ref 0.4–1.2)
GFR calc Af Amer: >60 mL/min (ref >60)
GFR calc non Af Amer: 55 mL/min — ABNORMAL LOW (ref >60)
Total Bilirubin: 0.6 mg/dL (ref 0.3–1.2)

## 2010-06-29 LAB — GLUCOSE, CAPILLARY
Glucose-Capillary: 129 mg/dL — ABNORMAL HIGH (ref 70–99)
Glucose-Capillary: 93 mg/dL (ref 70–99)

## 2010-06-29 LAB — CBC
HCT: 49.5 % — ABNORMAL HIGH (ref 36.0–46.0)
MCHC: 33.1 g/dL (ref 30.0–36.0)
MCV: 93.2 fL (ref 78.0–100.0)
Platelets: 241 10*3/uL (ref 150–400)

## 2010-07-01 ENCOUNTER — Encounter: Payer: Self-pay | Admitting: Internal Medicine

## 2010-07-02 ENCOUNTER — Telehealth: Payer: Self-pay | Admitting: Internal Medicine

## 2010-07-02 ENCOUNTER — Other Ambulatory Visit (INDEPENDENT_AMBULATORY_CARE_PROVIDER_SITE_OTHER): Payer: Medicare Other

## 2010-07-02 ENCOUNTER — Ambulatory Visit (INDEPENDENT_AMBULATORY_CARE_PROVIDER_SITE_OTHER): Payer: Medicare Other | Admitting: Internal Medicine

## 2010-07-02 ENCOUNTER — Encounter: Payer: Self-pay | Admitting: Internal Medicine

## 2010-07-02 DIAGNOSIS — E039 Hypothyroidism, unspecified: Secondary | ICD-10-CM

## 2010-07-02 DIAGNOSIS — E119 Type 2 diabetes mellitus without complications: Secondary | ICD-10-CM

## 2010-07-02 DIAGNOSIS — F411 Generalized anxiety disorder: Secondary | ICD-10-CM

## 2010-07-02 DIAGNOSIS — M549 Dorsalgia, unspecified: Secondary | ICD-10-CM

## 2010-07-02 LAB — HEMOGLOBIN A1C: Hgb A1c MFr Bld: 6.2 % (ref 4.6–6.5)

## 2010-07-02 NOTE — Telephone Encounter (Signed)
Please call patient - normal results (tsh and a1c). No medication changes recommended. Thanks.

## 2010-07-02 NOTE — Assessment & Plan Note (Signed)
Diet controlled, previously on metformin -  check labs today - Encouraged continued weight loss Lab Results  Component Value Date   HGBA1C 6.5 01/28/2010

## 2010-07-02 NOTE — Assessment & Plan Note (Signed)
The current medical regimen is effective;  continue present plan and medications. Check lab today Lab Results  Component Value Date   TSH 1.24 03/19/2010

## 2010-07-02 NOTE — Progress Notes (Signed)
Subjective:    Patient ID: [name], female    DOB: 04-Oct-1965, 45 y.o.   MRN: [id number]  HPI here for f/u  1) DM2 - previously followed with dr. Everardo All - currently diet controlled- prev on metformin reports sugars have been "ok" - running 110-120 - denies hypoglycemia events, no sugar >200  2) ovarian cancer - s/p surg and chemo 2009 follows with dr. 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 Charlett Blake) several times in past for same - on voltaren + vicodin - sometimes none, never more than 4/d expresses understanding that her weight is contributing to the pain  4) HTN - no recent med changes no adv SE of meds  5) depression/anxiety long hx same (started age 59s) - on meds for same feels symptoms controlled with current tx -  also on strattera with better results counseling started summer 2011 -doing well with this occ unable to get out of the house because of panic intol of prozac, seroquel, and zoloft; trazadone, celexa and effexor-  Resumed low dose sertraline 01/2010 infreq therapy visits b/c "hard to go when i can't leave the house" -  Also using strattera for adhd symptoms   6) morbid obesity -  weight loss and ongoing diet efforts reviewed - s/p eval by nutritionist  7) hypothyroid - reports compliance with ongoing medical treatment and no changes in medication dose or frequency. denies adverse side effects related to current therapy.   Past Medical History  Diagnosis Date  . ADENOCARCINOMA, OVARY, LEFT 08/10/2007  . HEPATITIS C 05/31/2007  . HYPOTHYROIDISM 11/11/2006  . DM 06/18/2008  . OBESITY, MORBID 11/11/2006  . Generalized anxiety disorder 09/17/2009  . TOBACCO ABUSE 02/05/2009  . DEPRESSION 05/31/2007  . ADHD 05/07/2009  . Chronic pain syndrome 02/05/2009  . HYPERTENSION 11/11/2006  . ALLERGIC RHINITIS 11/11/2006  . BACK  PAIN 05/31/2007    Review of Systems  Constitutional: Negative for fever.  Respiratory: Negative for cough and shortness of breath.   Cardiovascular: Negative for chest pain.  Genitourinary: Negative for dysuria.       Objective:   Physical Exam BP 112/76  Pulse 88  Temp(Src) 97.5 F (36.4 C) (Oral)  Ht 5' 7.5" (1.715 m)  Wt 312 lb 12.8 oz (141.885 kg)  BMI 48.27 kg/m2 Wt Readings from Last 3 Encounters:  07/02/10 312 lb 12.8 oz (141.885 kg)  01/28/10 337 lb 6.4 oz (153.044 kg)  10/18/09 344 lb (156.037 kg)   Physical Exam  Constitutional: Obese. She is oriented to person, place, and time. She appears well-developed and well-nourished. No distress.  Eyes: Conjunctivae and EOM are normal. Pupils are equal, round, and reactive to light. No scleral icterus.  Neck: Normal range of motion. Neck supple. No JVD present. No thyromegaly present.  Cardiovascular: Normal rate, regular rhythm and normal heart sounds.  No murmur heard. Pulmonary/Chest: Effort normal and breath sounds normal. No respiratory distress. She has no wheezes.  Abdominal: Soft. Bowel sounds are normal. She exhibits no distension. There is no tenderness.  Psychiatric: She has a normal mood and affect. Her behavior is normal. Judgment and thought content normal.   Lab Results  Component Value Date   WBC 12.4* 05/31/2010   HGB 15.1* 05/31/2010   HCT 47.7* 05/31/2010   PLT 287 05/31/2010   CHOL 148 06/01/2007   TRIG 160* 06/01/2007  HDL 20* 06/01/2007   ALT 34 01/10/2010   AST 36 01/10/2010   NA 138 05/31/2010   K 3.7 DELTA CHECK NOTED 05/31/2010   CL 97 05/31/2010   CREATININE 1.04 05/31/2010   BUN 7 05/31/2010   CO2 32 05/31/2010   TSH 1.24 03/19/2010   HGBA1C 6.5 01/28/2010       Assessment & Plan:  See problem list. Medications and labs reviewed today.

## 2010-07-02 NOTE — Patient Instructions (Signed)
It was good to see you today. Test(s) ordered today. Your results will be called to you after review (48-72hours after test completion). If any changes need to be made, you will be notified at that time. Medications reviewed, no changes at this time. Please schedule followup in 4 months, call sooner if problems.

## 2010-07-02 NOTE — Telephone Encounter (Signed)
Pt Notified with lab results.Marland KitchenMarland Kitchen3/28/12@4 :54LMB

## 2010-07-02 NOTE — Assessment & Plan Note (Signed)
chronic pain, uses hydrocodone as needed for same Should improve with continued weight loss

## 2010-07-02 NOTE — Assessment & Plan Note (Signed)
Controlling symptoms with low dose sertraline and strattera -  The current medical regimen is effective;  continue present plan and medications.

## 2010-07-14 LAB — CREATININE, SERUM
Creatinine, Ser: 1.01 mg/dL (ref 0.4–1.2)
GFR calc Af Amer: >60 mL/min (ref >60)
GFR calc non Af Amer: 60 mL/min — ABNORMAL LOW (ref >60)

## 2010-07-14 LAB — CBC
MCV: 90.5 fL (ref 78.0–100.0)
Platelets: 242 10*3/uL (ref 150–400)
Platelets: 276 10*3/uL (ref 150–400)
RBC: 5.17 MIL/uL — ABNORMAL HIGH (ref 3.87–5.11)
RBC: 5.34 MIL/uL — ABNORMAL HIGH (ref 3.87–5.11)
WBC: 12.1 10*3/uL — ABNORMAL HIGH (ref 4.0–10.5)
WBC: 8 10*3/uL (ref 4.0–10.5)

## 2010-07-14 LAB — URINE CULTURE
Colony Count: 75000
Special Requests: NEGATIVE

## 2010-07-16 ENCOUNTER — Other Ambulatory Visit: Payer: Self-pay | Admitting: Internal Medicine

## 2010-07-16 NOTE — Telephone Encounter (Signed)
Faxed script back to [facility] @ [phone]..07/16/10@2 :12pm/LMB

## 2010-08-01 ENCOUNTER — Ambulatory Visit: Payer: Medicare Other | Admitting: Physical Medicine & Rehabilitation

## 2010-08-01 ENCOUNTER — Other Ambulatory Visit: Payer: Self-pay | Admitting: Internal Medicine

## 2010-08-05 ENCOUNTER — Encounter (INDEPENDENT_AMBULATORY_CARE_PROVIDER_SITE_OTHER): Payer: Self-pay | Admitting: Surgery

## 2010-08-15 ENCOUNTER — Other Ambulatory Visit: Payer: Self-pay | Admitting: Internal Medicine

## 2010-08-18 ENCOUNTER — Other Ambulatory Visit: Payer: Self-pay | Admitting: Internal Medicine

## 2010-08-19 NOTE — Op Note (Signed)
[name], [name]               ACCOUNT NO.:  [id number]   MEDICAL RECORD NO.:  [id number]          PATIENT TYPE:  INP   LOCATION:  1529                         FACILITY:  [facility]   PHYSICIAN:  Ardeth Sportsman, MD     DATE OF BIRTH:  1965-11-14   DATE OF PROCEDURE:  DATE OF DISCHARGE:                               OPERATIVE REPORT   PRIMARY CARE PHYSICIAN:  Ginger Carne, M.D.   SURGEON:  Ardeth Sportsman, M.D.   PREOPERATIVE DIAGNOSIS:  1. Wound infection.  2. 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   POSTOPERATIVE DIAGNOSIS:  1. Wound infection.  2. 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   PROCEDURE PERFORMED:  Incision and debridement with partial  panniculectomy.   ANESTHESIA:  General.   DRAINS:  None.   ESTIMATED BLOOD LOSS:  100 ml.   SPECIMENS:  Left panniculus for anaerobic, aerobic, and fungal cultures.   INDICATIONS:  [name] is a 45 year old morbidly obese female who had  removal of a giant ovarian tumor through a low midline incision.  She  developed a wound infection with breakdown and had persistent necrosis  infection despite numerous debridements.  She has had numerous  debridements.  She has had two operative debridements and had a very  large indurated panniculus that is not improved after two debridements.  She had worsening fat necrosis and worsening induration and cellulitis  in her left panniculus, despite being on IV antibiotics.  Based on  concerns, the recommendation is made for operative exploration.  Risks,  benefits and alternatives were discussed, and the patient agrees to  proceed.   OPERATIVE FINDINGS:  The right side of her pannicular wound had very  mild fat necrosis.  The base of her fascia is intact with no evidence of  any fascitis in her midline.  Inferior flap and most of her left  inferior and left superior panniculus had a significant amount of  induration, going out about 3-6  cm and extending about another 10 cm.  Her resulting wound is now 70 cm across x 30 cm wide x 10 cm deep.   DESCRIPTION OF PROCEDURE:  Informed consent was confirmed.  The patient  was already on IV Zosyn and Flagyl, and caspofungin was readded, given  her history of Candida.  She underwent general anesthesia without any  difficulty.  She had sequential compression devices activated throughout  her entire case.  The patient was positioned supine.  Her abdomen and  wound were prepped and draped in a sterile fashion.  I went ahead and  used a sharp scalpel to cut the superior part of her left panniculus  back about 4 cm cephalad and until I got to completely soft tissue with  no more induration remaining.  I had healthy bleeding tissue.  I  continued this all the way laterally, lateral to anterior superior iliac  spine until I got rid of all indurated material within her panniculus,  since she kept having recurring cellulitis and necrosis in these  regions.  I got down to healthy tissue.  I did debride a bridge, as  there was about 5 cm undermining underneath her panniculus and her  fascia so the wound could be better exposed.   Careful debridement was done at the base of the fascia, and it was  viable.  There was a little bit of coagulant, but it was relatively  mild.  On sharp debride back, did make some samplings of fascia, and  fascia was attacked with no evidence of any necrotizing fascitis.  She  needed minimal debridement on the right side of her panniculus.  The  midline inferior flap had a fair amount of fat necrosis and trimming  back another 1-2 centimeters back until I got to some healthier bleeding  tissue.   The wound was irrigated copiously about 2 liters of saline.  Hemostasis  assured.  The wound was packed with four Kerlix and four ABD pads.  She  was extubated and taken to the recovery room in stable condition.  I am  about to explain the findings to her  fiance.      Ardeth Sportsman, MD  Electronically Signed     SCG/MEDQ  D:  09/21/2007  T:  09/21/2007  Job:  [id number]   cc:   Ginger Carne, MD

## 2010-08-19 NOTE — Telephone Encounter (Signed)
Faxed script back to [facility].Marland KitchenMarland Kitchen5/15/12@8 :46am/LMB

## 2010-08-19 NOTE — Consult Note (Signed)
[name], [name]               ACCOUNT NO.:  [id number]   MEDICAL RECORD NO.:  [id number]          PATIENT TYPE:  OUT   LOCATION:  GYN                          FACILITY:  [facility]   PHYSICIAN:  John T. Kyla Balzarine, M.D.    DATE OF BIRTH:  Nov 25, 1965   DATE OF CONSULTATION:  04/25/2008  DATE OF DISCHARGE:                                 CONSULTATION   CHIEF COMPLAINT:  Follow-up of ovarian cancer.   HISTORY OF PRESENT ILLNESS:  This patient was explored in May 2009  through a Pfannenstiel for a large pelvic mass.  She was found to have  an 18 cm ovarian tumor that was removed intact and washings were  negative.  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  A salpingo cysts and  endometriosis were noted from the contralateral adnexa and pelvic  tissues.  Preoperative CA-125 value was 94.5 and normalized  postoperatively.  The patient had severe morbidity from her abdominal  incision requiring several hospitalizations and five debridement  procedures.  She then completed six cycles of Taxol and carboplatin with  the last treatment given on April 05, 2008.  CA-125 values have  remained less than 20 with the most recent value 13 on 12/30.  The  patient tolerated chemotherapy relatively well with minimal numbness  involving the hands and feet.  She had diarrhea following each treatment  and required Neulasta after her first few treatments, resulting in  severe bony/muscle pain.  The patient does have a small nonhealing an  area in the central incision and has been aware of the bulge with  minimal discomfort.  She denies early satiety, bloating or bowel  dysfunction.  A CT scan was performed on 04/23/2008 and results are  available for review with the patient.   PAST MEDICAL HISTORY:  Morbid obesity, hypertension, GERD, anxiety and  depression, chronic low back pain,  hypothyroidism.  Prior NSVD and  ectopic pregnancy treated with laparotomy, laparoscopic cholecystectomy,  remote T&A, ovarian cancer therapy as above.   FAMILY HISTORY:  The patient's sister had breast cancer diagnosed in her  30s and her mother had lung cancer.   PERSONAL AND SOCIAL HISTORY:  The patient is a smoker and admits to  ethanol and illicit drug use in the past but claims none over the past  11 years.   MEDICATIONS:  Nexium, hydrochlorothiazide, Celexa, Synthroid, Allegra,  Desyrel, Glucophage, Colace, Dilaudid p.r.n.  The patient was treated  with a Z-Pak for recent URI approximately 10 days ago.   ALLERGIES:  SULFA CAUSES HIVES.   REVIEW OF SYSTEMS:  As noted above.  The patient has a recent URI.  She  has a frequent cough and nasal congestion.   PHYSICAL EXAMINATION:  VITAL SIGNS:  Stable and afebrile with weight 309  pounds (increased).  HEENT:  Reveals no oral pharyngeal erythema.  LUNGS:  Fields are clear.  The back is without spinous or CVA  tenderness.  Lymphatic survey is negative for pathologic  lymphadenopathy.  ABDOMEN:  Obese,  soft and benign.  There is a 1 cm area of incomplete  epithelialization in the central transverse incision, where it overlaps  remnants of a vertical incision.  Under this is a three x 4 cm umbilical  hernia, easily reducible.  No ascites or mass palpable.  EXTREMITIES:  Full strength and range of motion without edema, cords or Homan's.  PELVIC:  External genitalia and vault are clear.  Bladder and urethra  are normal.  On bimanual and rectovaginal examinations, no palpable mass  or nodularity, limited by habitus.   LABORATORY DATA:  CA-125 value on 12/30 was 13.  CT of abdomen and  pelvis obtained at [facility] is reviewed revealing no  pathologic lymphadenopathy, ascites or carcinomatosis and no mass.  The  patient has a stable small left lower pole renal lesion and anterior  wall hernia contains small bowel loops with  no evidence of obstruction.  Abdominal and lower thoracic viscera are normal.   ASSESSMENT:  1. Ovarian carcinoma, NED.  2. Umbilical hernia.  3. Upper respiratory infection.   PLAN:  The patient is given a prescription for Hycodan in the event that  Robitussin is not sufficient to control her coughing.  This likely has  increased her hernia.  We discussed expected management of the hernia  and I would recommend that she defer any surgical repair for at least a  year following chemotherapy.  In regards to her ovarian cancer, she was  reassured regarding the findings of the CT scan.  She is extremely  worried about the left renal lesion, which was not described size wise  on the current CT scan.  I would recommend that we obtain a follow-up CT  scan in approximately 6 months.  There is no evidence to suggest that  maintenance therapy will benefit patients with early ovarian cancer, and  I would recommend that Dr. Darrold Span and I alternate follow-up at 62-month  intervals, such that I would see her back for follow-up in 6 months.  CA-  125 should be obtained at each visit.      John T. Kyla Balzarine, M.D.  Electronically Signed     JTS/MEDQ  D:  04/25/2008  T:  04/25/2008  Job:  [id number]   cc:   Lennis P. Darrold Span, M.D.  Fax: [phone]   Sean A. Everardo All, MD  [address]  [place]  [place] [address]   Ardeth Sportsman, MD  [address] [place] [address]  [place], R.N.  [phone]. [address]  [place], [place] [address]

## 2010-08-19 NOTE — H&P (Signed)
[name], [name]               ACCOUNT NO.:  [id number]   MEDICAL RECORD NO.:  [id number]         PATIENT TYPE:  LINP   LOCATION:                               FACILITY:  [facility]   PHYSICIAN:  Adolph Pollack, M.D.DATE OF BIRTH:  1965/11/19   DATE OF ADMISSION:  10/24/2007  DATE OF DISCHARGE:                              HISTORY & PHYSICAL   CHIEF COMPLAINT:  Fever, increasing with pain.   HISTORY OF PRESENT ILLNESS:  This is a 45 year old female who had a TAH-  BSO for stage I ovarian cancer with a very complex wound problem  requiring return back to the operating room for debridements due to  panniculitis.  She was eventually said to be discharge, the care of Dr.  Star Age on the [facility], but the [facility] was removed last week.  She was  doing okay until yesterday.  She began running high fever and having  increasing wound pain and firmness on the right side and presented to  the emergency room where she was noted have a leukocytosis and a fever  and signs concerning for wound infection, and I was asked to see her.  No nausea or vomiting, bowels moving regularly.   PAST MEDICAL HISTORY:  1. Ovarian cancer.  2. Hypertension.  3. Morbid obesity.  4. Type 2 diabetes mellitus.  5. Hepatitis B.  6. Gastroesophageal reflux disease.  7. Hypothyroidism.  8. Chronic low back pain.  9. Anxiety.   PREVIOUS OPERATIONS:  1. Laparoscopic cholecystectomy.  2. Tonsillectomy/adenoidectomy.  3. Laparotomy for ectopic pregnancy.  4. TAH-BSO.  5. Multiple wound debridements following TAH-BSO.   ALLERGIES:  SULFA   MEDICATIONS:  Nexium, Vicodin, Celexa, L thyroid , Allegra, trazodone,  HCTZ, Colace, metformin.   SOCIAL HISTORY:  Married, here with her husband.  Does smoke cigarettes.  No alcohol use.   REVIEW OF SYSTEMS:  Notable for some mild dysuria.   PHYSICAL EXAMINATION:  GENERAL:  A slightly ill-appearing, morbidly  obese female, very pleasant.  Temperature is 101.8 maximum  pulse 92,  blood pressure 102/46 respiratory rate is 16 and O2 saturations are 92-  94% on room air.  HEENT:  Remarkable for her being edentulous.  CHEST:  Breath sounds equal and clear.  Respirations unlabored.  CARDIOVASCULAR:  Regular rate regular rhythm.  I do not hear murmurs.  ABDOMEN:  Soft.  There is a partially open lower transverse wound that  is clean.  Lateral to the wound on the right side, however, is an area  of slight redness and tenderness.  MUSCULOSKELETAL:  Good muscle tone.  No edema.  SKIN:  No jaundice.   LABORATORY DATA:  Notable for white blood cell count of 19,900.  A  glucose is 126.  The rest of her electrolytes being within normal  illness.  Urinalysis shows 7-10 white blood cells and a few bacteria.   IMPRESSION:  1. Localized wound infection.  2. Possible urinary tract infection.  3. Hypertension.  4. Type 2 by diabetes mellitus.  5. Hypothyroidism.   PLAN:  Will admit to the hospital and start her on IV  antibiotics  (vancomycin and Zosyn).  Will check blood cultures and urine culture.  Will obtain CT scan of the abdomen to look at the deep portion of the  wound as she bears a collection of fluid that needs to be drained.  Will  continue dressing changes.      Adolph Pollack, M.D.  Electronically Signed     TJR/MEDQ  D:  10/24/2007  T:  10/24/2007  Job:  [id number]   cc:   Ginger Carne, MD

## 2010-08-19 NOTE — Discharge Summary (Signed)
[name], [name]               ACCOUNT NO.:  [id number]   MEDICAL RECORD NO.:  [id number]          PATIENT TYPE:  INP   LOCATION:  9318                          FACILITY:  [facility]   PHYSICIAN:  Ginger Carne, MD  DATE OF BIRTH:  1965-05-03   DATE OF ADMISSION:  09/02/2007  DATE OF DISCHARGE:  09/08/2007                               DISCHARGE SUMMARY   REASON FOR HOSPITALIZATION:  Abdominal wall postoperative cellulitis  with subcutaneous wound dehiscence.   IN-HOSPITAL PROCEDURES:  Wound debridement and antibiotic management   FINAL DIAGNOSIS:  Postoperative abdominal wall cellulitis and  subcutaneous wound dehiscence   HOSPITAL COURSE:  This patient is a 45 year old Caucasian female who  underwent a total abdominal hysterectomy, bilateral salpingo-  oophorectomy, omentectomy with cystoscopy in place with bilateral  ureteral catheters and peritoneal washings on Aug 10, 2007.  The patient  demonstrated a well differentiated stage IA endometrioid carcinoma of  the left ovary.  The patient has been programmed with the GYN Oncology  and is scheduled for chemotherapy after wound healing.  When the patient  arrived, she had a foul-smelling discharge from the incision.  Her  fascia was intact and general surgical consultation by Bertram Savin, M.D.  was obtained.  At that time, debridement of the wound was performed.  In  addition, the patient was started on vancomycin and Zosyn.  Over the  course of her hospitalization, she had one additional debridement.  The  cellulitis that was predominant in the left lower quadrant had produced  a blackened area over her skin incision that was deemed not secondary to  necrotizing fasciitis.  Although she has had significant tenderness in  this area and has abated somewhat during the course of her  hospitalization.  She has remained afebrile and her laboratory work has  been within normal limits.  A wound care consultation was obtained, and  she  was changed from calcium alginate and a wound VAC to twice daily wet  to dry changes. Home health care will provide twice daily wound care  changes.   The patient was discharged with doxycycline 100 mg twice a day,  metformin 500 mg daily, Dilaudid 2 mg one to two every 4-6 hours as  needed for pain.  In addition, the patient will continue her home  medicines including Colace 100 mg twice a day, fexofenadine 180 mg a  day, hydrochlorothiazide 25 mg daily, Levothroid 250 mcg daily, Nexium  40 mg daily and trazodone 50 mg daily.   The patient will return to the GYN Clinic for followup in approximately  2 weeks and an appointment was made prior to her discharge.   Her discharge instructions also include contacting the office for fever,  chills, temperature above 100.4 degrees Fahrenheit, increasing foul-  smelling drainage or increasing pain or discoloration of her wound site.  The patient verbalized understanding of said instructions and all  questions answered to her satisfaction.     Ginger Carne, MD  Electronically Signed    SHB/MEDQ  D:  09/08/2007  T:  09/08/2007  Job:  [id number]

## 2010-08-19 NOTE — H&P (Signed)
[name], [name]               ACCOUNT NO.:  [id number]   MEDICAL RECORD NO.:  [id number]          PATIENT TYPE:  INP   LOCATION:  NA                           FACILITY:  [facility]   PHYSICIAN:  Ardeth Sportsman, MD     DATE OF BIRTH:  25-Sep-1965   DATE OF ADMISSION:  DATE OF DISCHARGE:                              HISTORY & PHYSICAL   PRIMARY CARE PHYSICIAN:  Sean A. Everardo All, MD   ONCOLOGIST:  Juanita Craver. Darrold Span, M.D.   She is also followed by Dr. Elinor Dodge with GYN.   HISTORY OF ADMIT:  Cellulitis of panniculus.   HISTORY OF PRESENT ILLNESS:  [name] is a pleasant 45 year old  morbidly obese female with BMI of 50 who struggled with postoperative  complications after acquiring hysterectomy and salpingo-oophorectomy for  a large ovarian cancer.  She developed wound infection and required  numerous debridements by our group to help Dr. Okey Dupre.  Ultimately after a  period of months, last year she eventually closed and healed up.  She  started chemotherapy followed by Dr. Darrold Span for ovarian cancer and just  finished that back in December.  She had been doing quite well and  active, but two days ago, she has noticed that she had some pain and  burning discomfort after wearing a new pair of panties and pants.  She  took them off after only wearing them a couple of hours.  However, she  noticed she started getting some redness and discomfort.  The redness  has spread to involve a fair amount of both sides of her panniculus now.  She started having a fever, about 102 yesterday as well, and some  discomfort.  Basically concerned, she called [place] and we fit her in today  for evaluation.   She denies any sick contacts and trail history.  She has chronic urinary  tract infection.  She is on Macrodantin, but she did not feel like she  is on nitrofurantoin, but she did not feel like that had changed at all  recently.  She did not have any readmissions since I saw her last year.   PAST  MEDICAL HISTORY:  1. Morbid obesity, BMI of 50.  2. Endometrial type of stage 1 ovarian carcinoma status post Taxol and      carboplatin followed by Dr. Darrold Span.  3. Diabetes, non-insulin requiring, off medications.  4. Hypertension.  5. Hypothyroidism.  6. Anxiety.  7. Hepatitis B exposure.  8. Chronic low back pain.  9. Depression.  10.Gastroesophageal reflux disease.   PAST SURGICAL HISTORY:  1. She had removal of an ectopic pregnancy in 1998.  2. She had a tonsillectomy in 1985.  She had a cholecystectomy in      2005.  3. She had incision and drainage of a panniculus wound infections with      Candida and Bacteroides from June and July of 2009 x4.   MEDICATIONS:  She is on:  1. Levothyroxine.  2. Hydrochlorothiazide.  3. Hydrochlorothiazide.  4. Trazodone.  5. Citalopram.  6. Nexium.  7. Nitrofurantoin.  8. Allegra.  ALLERGIES:  TO SULFA.   SOCIAL HISTORY:  She is widowed but she is in a stable relationship.  She still smokes half a pack a day.  She has one child.  Denies any  alcohol or other drug use.  Currently not working.  Recovering from her  cancer.   FAMILY HISTORY:  Father had hepatobiliary cancer and stroke and died  from that.  She has 10 siblings:  4 sisters who passed away from cancer,  6 that are alive.   REVIEW OF SYSTEMS:  CONSTITUTIONAL:  Is noted in the chart.  No change  in weight gain or weight loss.  Breasts:  No dscharge or pain.  Infectious disease:  Hepatitis B as noted above.  Dental:  She does have  some issues with dentures.  GYN:  Status post hysterectomy.  Otherwise,  no vaginal bleeding or discharge.  Cardiac:  She does have some high  blood pressure but otherwise negative.  Pulmonary:  Negative.  Endocrine:  Hypothyroidism controlled with medicine.  Diabetes:  Controlled with exercise.  Dermatologic:  As noted above.  Occasional  nausea and poor appetite and abdominal discomfort, but that has been  gradually improving off the  chemotherapy.  GU:  She has a history of  urinary tract infections on chronic antibiotic correction.  History of  kidney stones in the distant past.  Neurological:  Negative.  Hematological and lymphs:  Negative.  Immunological:  Currently has  cancer but otherwise negative.  HEENT:  She had a recent sinus infection  and soreness, but she has been recovering from that.  Musculoskeletal:  Joint arthroscopy in the hips, greater in the knees.   VITAL SIGNS:  Temperature 97.8, pulse 98, respirations 16, blood  pressure 118/70, weight 329 pounds.  Height 5 foot 8.  BMI 50.  GENERAL:  She is a well-developed, morbidly obese female, uncomfortable  and tired but frankly toxic.  PSYCH:  She is mildly anxious but consolable.  No evidence of  intermittent delirium, psychosis or paranoia.  EYES:  Pupils equal, round, reactive to light, extraocular movements are  intact.  NEUROLOGICAL:  Cranial nerves II-XII are intact.  Hand grip 5/5, equal  and symmetrical, no rest or intention  tremors.  HEENT:  She is normocephalic.  Mucous membranes are moist.  Nasopharynx  and oropharynx clear.  NECK:  Supple without masses, trachea is midline.  HEART:  Regular rate and rhythm, no murmurs, rubs or gallops.  CHEST:  Clear to auscultation bilaterally, no wheezes, rales or rhonchi.  ABDOMEN:  Morbidly obese and soft.  Her large transverse panniculus  incision actually is well healed, although she does have evidence of a  ventral hernia, probably about 4 cm paraumbilically.  To the left of her  now everted umbilicus, she has a 2 x 15 mm open wound or tear I should  say.  There is no cellulitis around that interestingly.  She has about a  10 x 80 cm area of erythema in sort of a bow-tie fashion where the  umbilicus is not as involved and spreads out more the panniculi.  It is  symmetrical.  Bright red.  It is warm and tender.  GENITALIA/RECTAL:  Deferred.  EXTREMITIES:  No obvious clubbing, cyanosis, edema   LYMPH: No head and neck, axillary or groin lymphadenopathy.  SKIN:  No cellulitis as noted above.  No obvious petechia or purpura.   ASSESSMENT/PLAN:  1. A 45 year old morbidly obese female with diabetes and smoking with  chronic wound infection issues growing positive for Candida and      some Bacteroides but no history of prior methicillin-resistant      Staphylococcus aureus or other issues with worsening erythema, pain      and fevers accelerating in the past 48 hours.  (1)  Admit.  (2)  IV      antibiotics.  I would start her on some IV fluconazole and Zosyn      given her diabetes and prior Candida history.  If she does not      improve, I would give her vancomycin, but given her no history of      MRSA, would hold off on that for right now.  2. Hypertension, controlled.  3. Depression, controlled.  4. __________.  5. Follow her thyroid status.  6. Diabetes, stable.  Will put her on sliding scale insulin unless      something changes.      Ardeth Sportsman, MD  Electronically Signed     SCG/MEDQ  D:  09/06/2008  T:  09/06/2008  Job:  [id number]   cc:   Gregary Signs A. Everardo All, MD  [address]  [place]  [place] [address]   Lennis P. Darrold Span, M.D.  Fax: [phone]   Phil D. Okey Dupre, M.D.

## 2010-08-19 NOTE — Op Note (Signed)
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   PROCEDURE PERFORMED:  Incision and then debridement of panniculus left  greater than right with partial panniculectomy.   ANESTHESIA:  General anesthesia.   ESTIMATED BLOOD LOSS:  100 mL.   COMPLICATIONS:  No major complications.   SPECIMENS:  Wedge of left panniculus and right panniculus.   DRAINS:  None.   INDICATIONS:  [name] is a super-obese 45 year old female who had  resection of a very large ovarian mass with unfortunate wound infection.  She failed IV antibiotics and bedside debridement and returned with  worsening undermining necrosis.  She was taken to the operating room and  had more aggressive excision of tissue which seemed like healthy tissue  on September 09, 2007.  She is now postoperative day 5.  She initially seemed  to improve, but then her left panniculus wound had worsening fat  necrosis going several inches deep and her induration which had been  decreasing suddenly worsened with significant pain in her left  panniculus.  Given to failure on IV antibiotics and wound VAC and wound  care, recommendations was made for further excision and debridement.  Risks, benefits, and alternatives were discussed and she agreed to  proceed.   OPERATING FINDINGS:  The right side of her panniculus was rather clean.  The base of her wound was mostly cleaned with only some mild  coagulum.  The left panniculus still had significant induration and fat necrosis  extending for region of about 10 x 20 cm.  I did debride back to much  healthier tissue.   DESCRIPTION OF PROCEDURE:  Informed consent was confirmed.  The patient  was already on IV vancomycin, Zosyn, and fluconazole, given the Candida  positive infection.  She underwent general anesthesia without any  difficulty.  She was positioned supine.  Her abdomen and panniculus were  prepped and draped in sterile fashion.  At one end, I used a sharp  scalpel to cut out a large wedge of her left panniculus in a triangular  shape about 10 x 15 x 20 cm in size.  I got healthy bleeding all around.  Hemostasis assured using cautery.  There was a larger vein on the  inferior part of the panniculus that was controlled using a 2-0 Vicryl  stitch.  I had debrided another couple of centimeters on the cephalad  side since there was hard induration, but it ended into a softer tissue,  so there was minimal induration  with good healthy bleeding tissue.  With  this, the wound had a much more clam-shaped appearance as opposed to its  initial.  The left panniculus had a very traverse wound and the right  side had a very vertical wound.  Therefore, I did partial panniculectomy  on the right side and took a wedge of about 10 x 10 cm to help make it  all more transverse clamshell scalp-like wound and that helped it close  down much more easily.   Did further debridement a the base of the fascia, was viable.  There was  a little bit of coagulum on the fascia, but I got at healthy bleeding  fascia around.  There was no evidence of any necrotizing fasciitis.  Hemostasis was assured.  I went ahead and packed the wound with 3 rolls  of moistened Kerlix and dry dressing was placed.   The patient is getting extubated and will go to the recovery room.  She  was stable throughout the case.  There is no family here in the  hospital, but I  will try and locate them by phone.  Dr. Mia Creek  unfortunately is not available, but I will discuss with him as well.      Ardeth Sportsman, MD  Electronically Signed     SCG/MEDQ  D:  09/14/2007  T:  09/15/2007  Job:  [id number]

## 2010-08-19 NOTE — Consult Note (Signed)
[name], [name]               ACCOUNT NO.:  [id number]   MEDICAL RECORD NO.:  [id number]          PATIENT TYPE:  OUT   LOCATION:  GYN                          FACILITY:  [facility]   PHYSICIAN:  John T. Kyla Balzarine, M.D.    DATE OF BIRTH:  1965/08/02   DATE OF CONSULTATION:  11/23/2007  DATE OF DISCHARGE:                                 CONSULTATION   CHIEF COMPLAINT:  Ovarian cancer complicated by wound infection.   HISTORY OF PRESENT ILLNESS:  This patient was explored on May 2006  through a Pfannenstiel for large pelvic mass.  She was found to have an  18-cm ovarian tumor that was removed intact with no adhesions or  rupture.  Washings were histologically negative, and final pathology  revealed an endometrioid ovarian adenocarcinoma, well-differentiated and  involving the left ovary with negative uterine serosa, right ovary and  omentum.  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  Her preoperative CA-125 value  was 94.5 and normalized to 12 U/mL on June 30.  Unfortunately, the  patient has had severe morbidity from her abdominal incision, requiring  several hospitalizations and 5 debridement procedures.  She had a wound  VAC for many weeks, and currently her husband is performing wound care  with packing once daily.  Per photographs of the incision, it has good  granulation tissue with a depth of 3 cm and width of approximately 1-2  cm, length approximately 8 cm.   PAST MEDICAL HISTORY:  1. Morbid obesity with hypertension.  2. GERD.  3. Anxiety.  4. Depression.  5. Chronic back pain.  6. Hypothyroidism.  7. Prior NSVD and ectopic pregnancy treated with laparotomy.  8. Laparoscopic cholecystectomy.  9. Remote T&A.  10.Ovarian cancer surgery as above.   FAMILY HISTORY:  The patient's sister had breast cancer diagnosed in her  74s and mother had lung cancer.   PERSONAL AND SOCIAL HISTORY:  The patient is a smoker and in the  past  admits to ethanol and illicit drug use but claims none used over the  past 11 years.   CURRENT MEDICATIONS:  1. Nexium.  2. Hydrochlorothiazide.  3. Celexa.  4. Synthroid.  5. Allegra.  6. Desyrel.  7. Vicodin ES.  8. Glucophage.  9. Colace.  10.Dilaudid p.r.n. (not used recently).   ALLERGIES:  SULFA CAUSES HIVES.   REVIEW OF SYSTEMS:  The patient has been diagnosed as having glucose  intolerance during hospitalizations for wound care.  She states that her  blood sugars have been in the 100-140 range since starting oral  hypoglycemics.  Other than noted above, negative 10-point review of  systems.  She currently has essentially no pain.   PHYSICAL EXAMINATION:  Weight 192 pounds and vital signs stable.  The  patient is anxious, alert and oriented x3 in no acute distress.  LYMPHATIC SURVEY:  No pathologic lymphadenopathy.  BACK:  No spinous or CVA tenderness.  ABDOMEN:  Morbidly obese with pannus.  Transverse incision is  approximately 2/3 healed at the level of the skin.  There is a residual  8-cm long defect measuring up to 3 cm deep in the right third of the  incision with a good base of granulation tissue.  There is no evidence  of underlying mass, and there is minimal tenderness associated with  this.  No hernias, ascites or other masses palpable.  EXTREMITIES:  Full strength and range of motion without edema, cords or  Homan's.  PELVIC:  External genitalia and vulva are clear.  Bimanual and  rectovaginal examinations reveal minimal postoperative induration of the  cuff but no mass, nodularity or tenderness.   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  Delayed wound healing secondary to wound infection  complicated by diabetes and obesity.   RECOMMENDATIONS:  I discussed the patient in person with Dr. Darrold Span,  and even though it is been just over 3 months since her initial surgery,  I would recommend adjuvant treatment  with 4 to 6 cycles of carboplatin  and Taxol.  Dr. Darrold Span is in agreement with this plan, and we will set  up chemotherapy teaching in her first cycle of chemotherapy in the near  future.      John T. Kyla Balzarine, M.D.  Electronically Signed     JTS/MEDQ  D:  11/23/2007  T:  11/23/2007  Job:  [id number]   cc:   Telford Nab, R.N.  [address]  [place], [place] [address]   Lennis P. Darrold Span, M.D.  Fax: [phone]   Ginger Carne, MD   Ardeth Sportsman, MD  [address] [place] [address]   [facility]  Fax: [phone]

## 2010-08-19 NOTE — Op Note (Signed)
[name], [name]               ACCOUNT NO.:  [id number]   MEDICAL RECORD NO.:  [id number]          PATIENT TYPE:  INP   LOCATION:  1537                         FACILITY:  [facility]   PHYSICIAN:  Adolph Pollack, M.D.DATE OF BIRTH:  1966/01/04   DATE OF PROCEDURE:  10/25/2007  DATE OF DISCHARGE:                               OPERATIVE REPORT   PREOPERATIVE DIAGNOSIS:  Recurrent panniculitis.   POSTOPERATIVE DIAGNOSIS:  Recurrent panniculitis.   PROCEDURE:  Incision and debridement of recurrent panniculitis.   SURGEON:  Adolph Pollack, M.D.   ANESTHESIA:  General.   INDICATIONS:  This is a 45 year old female with a TAH-BSO that was  complicated by significant wound problems.  Dr. Michaell Cowing took her back to  the operating room multiple times for debridement of her wound secondary  to panniculitis.  She had been doing well for the past month and have  had her VAC taken off and started dressing changes, when she presented  to the emergency department early Monday morning with high a fever,  recurring cellulitic changes and leukocytosis.  She also had a urinary  tract infection.  She was placed on broad-spectrum IV antibiotics, but  today the erythema is spreading and she is more tender.  It looks worse  and now she is being brought back to the operating room.   TECHNIQUE:  She is brought to the operating room and placed supine on  the operating table and general anesthetic was administered.  The  abdominal wall was sterilely prepped and draped.  The right side of her  wound and right lower quadrant region were involved with some erythema  and induration of the pannus.  It  began where she had a partially open  wound and then we began excising the skin and subcutaneous tissue.  The  skin and the dermis were mostly involved, some the subcutaneous tissue  was involved with the process, but there was no obvious abscess present.  I debrided this, all the involved skin and subcutaneous  tissue using  electrocautery and sent this to pathology.   Following this, I then cauterized the areas that were bleeding and once  bleeding was controlled, I then packed the wound with saline moistened  Kerlix followed by a bulky dressing.   She tolerated the procedure without any apparent complications and was  taken to recovery room in satisfactory condition.      Adolph Pollack, M.D.  Electronically Signed    TJR/MEDQ  D:  10/25/2007  T:  10/25/2007  Job:  [id number]

## 2010-08-19 NOTE — Op Note (Signed)
[name], [name]               ACCOUNT NO.:  [id number]   MEDICAL RECORD NO.:  [id number]          PATIENT TYPE:  INP   LOCATION:  9309                          FACILITY:  [facility]   PHYSICIAN:  Ardeth Sportsman, MD     DATE OF BIRTH:  [birth year]   DATE OF PROCEDURE:  09/09/2007  DATE OF DISCHARGE:                               OPERATIVE REPORT   SURGEON:  Ardeth Sportsman, MD.   ASSISTANT:  Ginger Carne, MD.   PREOPERATIVE DIAGNOSIS:  Panniculitis and wound infection, status post  abdominal hysterectomy for ovarian cancer.   POSTOPERATIVE DIAGNOSIS:  Panniculitis and wound infection, status post  abdominal hysterectomy for ovarian cancer.   PROCEDURE PERFORMED:  Incision and debridement of panniculus through  skin, soft tissue, and fascia.   ANESTHESIA:  General anesthesia.   SPECIMENS:  Tissue was sent for culture and tissue was also sent for  pathology.   DRAINS:  None.   ESTIMATED BLOOD LOSS:  About 30 mL.   COMPLICATIONS:  No major complications.   INDICATIONS:  [name] is a 45 year old morbidly obese female who is  found to have a very large pelvic mass and was taken to the operating  room and found to have a large left ovarian mass.  She underwent total  abdominal hysterectomy, bilateral salpingo-oophorectomy, omentectomy,  and peritoneal washing.  Skin was debrided down to less then 0.75  centimeter of remaining tissue.  This was a month ago.  Postoperatively,  she initially recovered well, but developed a wound infection with  drainage.  She did not improve with outpatient care and was admitted,  placed on IV antibiotics, and underwent 2 bedside debridements.  She  seemed improved and her leukocytosis had improved; however, she returned  about 24 hours later after her discharge with worsening pain and  swelling.   Dr. Mia Creek requested surgical evaluation given the complexity of the  wound in the panniculus and concerns for a mass on the left phlegmon.   A  CT scan was done, which ruled out any intra-abdominal process or  interpannicular abscess, but given the large significant volume of  necrotic tissue in the wound, the recommendation was made for incision  and debridement for better wound management and reduction and bacterial  load.  The risks, benefits, and alternatives were discussed with the  patient, and she agreed to proceed.   OPERATING FINDINGS:  The right side of her wound and panniculus actually  was quite clean from the skin, almost down to the fascia.  She had  significant undermining about 8 cm cephalad towards the umbilicus with  some coagulum and a little bit of necrosis of the fascia.  She had much  more significant involvement of the left half of her panniculus with a  20 x 40 cm region of the induration.  She had about 10 cm of undermining  laterally.  We ended up having to take off a fair amount of necrotic  fat, as well as a necrotic tip of her panniculus to get to more healthy  tissue.  There was no evisceration  of small bowel, although I worry that  her fascia has partially necrosed.  There was no evidence of any  necrotizing fasciitis.   DESCRIPTION OF PROCEDURE:  Informed consent was confirmed.  The patient  was already on IV vancomycin and Zosyn.  She voided just prior to  entering the operative room.  She underwent general anesthesia without  any difficulty.  Her abdomen, panniculus, and wound were prepped and  draped in sterile fashion.  Debridement was done using primarily scalpel  to help debride off especially the left side of the wound and the  panniculus to free off dead fat and coagulum.  We essentially debrided  off all the tissue anterior to the undermined area.  We took off several  centimeters of skin lateral to the left side of the wound in a series of  arcs until we got to healthier bleeding fatty tissue.  There was still a  fair amount of induration, and the tissue looked viable, and it was   bleeding, so we did not feel it required move aggressive debridement.   We carried the debridement down to the level of fascia.  We ended up  having to open up the wound superiorly to help open up against the  undermining that was occurring and was able to get all the way up to  about the level of the umbilicus.  There was subcoagulant that was  carefully debrided off at the level of fascia.  There were some old PDS  stitches that were removed.  There was no evidence of any small bowel  evisceration or involvement.  The deeper abdominal wall layers were  intact.  I did carefully debride some coagulum off there, but tried to  avoid over-debridement at this level.  Debridement took about 45  minutes.   When we were done, we had a 1 sunken open wound with no more undermining  with a good open part of the wound.  The wound dimensions are about 20 x  30 cm in size and about 15 cm deep with the inferior part of the wound  more about 10 x 10 cm down inferiorly.  The wound was packed using  likely Betadine soaked 2 x 4 Kerlix and a large amount of 4 x 4s that  were soaked in some just slight Betadine soak and saline.  Steri-Strip  was applied.  The patient was extubated and taken to the recovery room  in stable condition.   Dr. Mia Creek was to talk to the family member about operative findings.   We suspect she may need serial debridements, given the large mass of the  wound, but we will follow expectantly and continue IV antibiotics.      Ardeth Sportsman, MD  Electronically Signed     SCG/MEDQ  D:  09/09/2007  T:  09/10/2007  Job:  [id number]   cc:   Ginger Carne, MD

## 2010-08-19 NOTE — Consult Note (Signed)
[name], [name]               ACCOUNT NO.:  [id number]   MEDICAL RECORD NO.:  [id number]          PATIENT TYPE:  INP   LOCATION:  9318                          FACILITY:  [facility]   PHYSICIAN:  Lennie Muckle, MD      DATE OF BIRTH:  [birth year]   DATE OF CONSULTATION:  09/02/2007  DATE OF DISCHARGE:  08/25/2007                                 CONSULTATION   REASON FOR CONSULTATION:  Abdominal wall cellulitis evaluate for a need  for debridement.   REQUESTING PHYSICIAN:  Lesly Dukes, MD   HISTORY OF PRESENT ILLNESS:  [name] is a 45 year old female who had  a TAH/BSO on Aug 10, 2007.  She had dehiscence of her wound closure  subsequently with packings of calcium alginate and a wound VAC was  placed.  On Aug 31, 2007, the nurse was noticing, she has a blackened  area of her incision and had increased pain noted.  She has also had  foul odor from her wound bed.  She had some reports of chills as well.  Some nausea but no vomiting.  She did complain of more pain in the left  lower incision site.   PAST MEDICAL HISTORY:  Significant for ovarian cancer.  She had a well-  differentiated adenocarcinoma.  She has diabetes, hypertension, reflux,  morbid obesity, anxiety, chronic back pain, depression, hepatitis B, and  hypothyroidism.   FAMILY HISTORY:  Coronary artery disease, cancer, diabetes, and  hypertension.   PAST SURGICAL HISTORY:  She had a cholecystectomy, ectopic pregnancy,  hysterectomy, and tonsillectomy.   SOCIAL HISTORY:  No alcohol use.  She does smoke tobacco.   ALLERGIES:  She has an allergy to SULFA which causes hives.   REVIEW OF SYSTEMS:  She does give a history of question of sleep apnea.  She has not been diagnosed with this.  No cardiac history.  No pulmonary  history.  She had constipation following her surgery.   PHYSICAL EXAMINATION:  She is lying in bed in no acute distress.  Temperature 98.4, pulse 96, and 90% on room air.  HEENT:  Extraocular  muscles are intact.  ABDOMEN:  Obese.  SKIN:  Only mild erythema at the wound bed of the midline incision.  There is also a small necrotic ulcer just beneath the left panniculus.  There is a necrotic fat noted at the edges and deeper in the wound bed.  Some clear fluid drainage is noted within the wound.   White count that is slightly elevated at 12.4 and hemoglobin and  hematocrit 11.4 and 33.9.   ASSESSMENT AND PLAN:  Left minimal abdominal wall cellulitis with fat  necrosis.  Procedure was performed at the bedside, with  small amount of  removal of necrotic fat from the wound and began removing some of those  tissue in which she had began to have pain.  Therefore, I was able to  remove a small of the purulent fluid noted.  No purulent fluid was  expressed, so gently probed to the left side of the abdominal wall to  see if there  was a wound that tracked medially to the ulceration.  This  seemed to be nothing deep in the tissues.  Therefore, I would recommend  Decon solution to the wound twice daily to help with some of the  necrosis.  This also helped to debride some of the tissues.  Continue  debridement as needed until the wound is cleaned somewhat more.  We  would continue with the Zosyn and vancomycin for now.  No acute surgical  needs needed tonight.  We will continue to monitor for any changes and  review.  However, I will re-evaluate the wound this coming Sunday.      Lennie Muckle, MD  Electronically Signed     ALA/MEDQ  D:  09/02/2007  T:  09/03/2007  Job:  [id number]

## 2010-08-19 NOTE — Discharge Summary (Signed)
NAMEJELICIA, [name]               ACCOUNT NO.:  [id number]   MEDICAL RECORD NO.:  [id number]          PATIENT TYPE:  INP   LOCATION:  9315                          FACILITY:  [facility]   PHYSICIAN:  Phil D. Okey Dupre, M.D.     DATE OF BIRTH:  November 24, 1965   DATE OF ADMISSION:  08/10/2007  DATE OF DISCHARGE:  08/14/2007                               DISCHARGE SUMMARY   The patient is a 45 year old Caucasian female, morbidly obese at more  than 300 pounds, who underwent exploratory laparotomy, who was thought  to be a fibroid uterus diagnosed by CT and on entry was found to have a  20-cm left ovarian mass, which turned out to be left well-differentiated  stage IA adenocarcinoma of the ovary.  The patient had ureteral  catheters placed prior to the procedure bilaterally and underwent total  abdominal hysterectomy, bilateral salpingo-oophorectomy and omentectomy  with a postoperative cystoscopy.   SURGEON:  Ginger Carne, MD.   ASSISTANT:  Norton Blizzard, MD.   ESTIMATED BLOOD LOSS:  During the procedure was 1500 mL.   The patient did extremely well postoperatively.  Considering her weight,  she had been completely afebrile during her postoperative course.  On  the postoperative day, she had a hemoglobin of 6.9, which is down from  13.1 at the time of the surgery and was transfused four units of packed  cells.  At the time of discharge, she had a hemoglobin of 11.   PHYSICAL EXAMINATION:  At the time of discharge:  LUNGS: Clear.  ABDOMEN: Soft and flat.  She has had bowel movements, several and  successful this morning.  She had been voiding well.  The incision is  clean.  No sign of any seroma or hematoma.  EXTREMITIES: Negative.   The patient has been giving extensive discharge instructions as to  activity, follow up, and medication.   She is being discharged on Percocet two q.6 h. p.r.n. pain, Keflex 500  b.i.d. for 7 days; Slow FE for iron replacement, not to be started until  she is having regular bowel movements every day.  She will return to  taking her preoperative medications all listed in her chart, baby  aspirin, Levothroid, hydrochlorothiazide, Celexa, Allegra, Nexium,  Tridione, and Hydrocordone.  She has an appointment for follow up on the  Aug 17, 2007, for staple removal in the GYN clinic at 1 o'clock and on  the Aug 29, 2007, she has an appointment with Dr. Ronita Hipps, GYN  oncologists from [facility] at their [facility] office at 1:45, given those  instructions as well as written reminders.   DISCHARGE DIAGNOSES:  1. Recovering well.  2. Post total abdominal hysterectomy.  3. Bilateral salpingo-oophorectomy and omentectomy for ovarian      carcinoma.      Phil D. Okey Dupre, M.D.  Electronically Signed     PDR/MEDQ  D:  08/14/2007  T:  08/15/2007  Job:  [id number]

## 2010-08-19 NOTE — Discharge Summary (Signed)
[name], [name]               ACCOUNT NO.:  [id number]   MEDICAL RECORD NO.:  [id number]           PATIENT TYPE:   LOCATION:                                 FACILITY:   PHYSICIAN:  Ardeth Sportsman, MD     DATE OF BIRTH:  Jul 16, 1965   DATE OF ADMISSION:  09/09/2007  DATE OF DISCHARGE:  09/28/2007                               DISCHARGE SUMMARY   GYN ONCOLOGIST:  Ginger Carne, M.D.   SURGEON:  Ardeth Sportsman, M.D.   FINAL DISCHARGE DIAGNOSES:  1. Necrotizing panniculitis, status post wound infection.  2. 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  3. Superobesity.  4. Hypertension.  5. Gastroesophageal reflux disease.  6. Anxiety.  7. Depression.  8. Chronic back pain.  9. Hypothyroidism.  10.Status post surgery for ectopic pregnancy and cystostomy in 1998.  11.Status post laparoscopic cholecystectomy in 2005.  12.Status post tonsillectomy and adenoidectomy.  13.Question of glucose intolerance/borderline.   PROCEDURES PERFORMED:  1. Incision and debridement of panniculus through skin, soft tissue,      and fascia on September 09, 2007.  2. Re-debridement with left greater than right partial panniculectomy      on September 14, 2007.  3. Re-debridement with more extensive left panniculectomy on September 21, 2007.   SUMMARY OF HOSPITAL COURSE:  [name] is a superobese female smoker  who had a giant tumor of her left ovary.  She had excision of this, as  noted above, back on Aug 10, 2007.  She postoperatively developed a wound  infection, then had breakdown at the skin and soft tissue.  This was  managed with local wound care.  She returned to the hospital on Aug 30, 2007.  She was discharged on postoperative day #4.  She had evidence of  cellulitis postoperatively and had wound breakdown.  She ended up being  readmitted on  Aug 30, 2007 and underwent a couple bedside debridement  and antibiotics and seemed to initially improve.  She went home on September 08, 2007 but bounced back the next day with worsening pain and swelling  in the left panniculus.     She had been followed by Dr. Bertram Savin with surgery before, but at  this point Dr. Mia Creek requested surgical consultation with me.  Given  her obvious pain and discomfort, I took her to the operating room and  debrided back necrotic tissue on the left side of the wound and removed  all undermining.  She still had some induration in her left panniculus,  but no evidence of any definite necrosis.   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  She initially  had some improvements but then had worsening necrosis of the left side  of her wound with worsening pain and worsening cellulitis of her left  panniculus.   Therefore, we took her to the operating room on  September 14, 2007 and did  more aggressive excision of all the indurated, woody pannicular material  on the left panniculus and then wedged out some of the right side of the  wound panniculus to help changes from a deep infraumbilical midline  incision into a broader transverse incision.  Her fascia was  reinspected, and there was no evidence of any necrotizing fasciitis.   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  It also grew a Candida.  I ended up transitioning her over just to Zosyn and Flagyl only for  better anaerobic coverage.  We did have her on fluconazole  intermittently.  Initially after the second debridement she seemed to  clean up.  She did have a few patches of necrosis debrided at the  bedside for a few days.  However, she started having worsening pain and  cellulitis and induration and necrosis on the left side of her abdomen.  I switched her over to caspofungin to make sure that she did not have  any resistant Candida.  She did not improve.  I took her to the  operating  room and did more aggressive excision until I removed all  possible induration to healthier tissue.   The wound was left packed open and followed through for 3 days.  It  seemed to remain stable, and her white count normalized.  We placed her  on a wound VAC.  The wound VAC remained on for 2 days and seemed stable,  removed the wound VAC, and the wound seemed to clean up, and the wound  was a healing well, with a greater than 95% healthy tissue and had a  granulation.  The fascia was intact.   The patient improved and was discharged home with the follow  instructions:  1. She will need to follow up and see me in about a week for a wound      check.  2. She is continue a wound VAC 3 times a week to help with wound      closure and recovery.  3. She should call if she has worsening pain, fevers, chills, sweats,      drainage, or other difficulties.  4. We will keep her off all antibiotics, since last debridement      cultures were negative and she had no cellulitis off antibiotics      for 48 hours, but will monitor expectantly.  5. She should resume her home medications, which include Nexium 40 mg      daily, Synthroid 250 mcg daily, Toradol 10 mg p.r.n. daily,      hydrochlorothiazide 25 mg daily, trazodone 50 mg daily,      fexofenadine 100 mg daily, Colace her room or other stool softener      100 mg p.o. b.i.d. as tolerated.  6. I wrote for some pain control for her including naproxen 500 mg      p.o. b.i.d.  7. Ice packs or heating pads.  8. Abdominal binder as tolerated for better support.  9. Percocet 5/325, 1-2 p.o. q.4 h. p.r.n. pain.      Ardeth Sportsman, MD  Electronically Signed     SCG/MEDQ  D:  09/28/2007  T:  09/28/2007  Job:  [id number]   cc:   Ginger Carne, MD

## 2010-08-19 NOTE — Discharge Summary (Signed)
[name], [name]               ACCOUNT NO.:  [id number]   MEDICAL RECORD NO.:  [id number]          PATIENT TYPE:  INP   LOCATION:  1505                         FACILITY:  [facility]   PHYSICIAN:  Ardeth Sportsman, MD     DATE OF BIRTH:  April 09, 1965   DATE OF ADMISSION:  09/06/2008  DATE OF DISCHARGE:  09/09/2008                               DISCHARGE SUMMARY   PRIMARY CARE PHYSICIAN:  Sean A. Everardo All, MD   ONCOLOGIST:  Juanita Craver. Darrold Span, M.D.   GYNECOLOGISTMichele Mcalpine D. Okey Dupre, M.D.   PRIMARY DISCHARGE DIAGNOSES:  Cellulitis of panniculus.   OTHER DIAGNOSES:  1. Morbid obesity, body mass index 51.  2. Endometrial stage I ovarian carcinoma, status post Taxol,      carboplatin therapy, followed by Dr. Darrold Span, completed December,      2009.  3. Diabetes, non-insulin requiring, off medications.  4. Hypertension.  5. Hypothyroidism.  6. Anxiety.  7. Hepatitis B positive.  8. Chronic low back pain.  9. Depression.  10.Gastroesophageal reflux disease.  11.Status post ectopic pregnancy removal in 1998.  12.Tonsillectomy in 1985.  13.Cholecystectomy in 2005.  14.Abdominal hysterectomy, bilateral salpingo-oophorectomy in 2009.  15.Incision and drainage of panniculi wound infections with Candida      and Bacteroides from June and July of 2009 x4.  16.Ventral hernia, nonobstructing, non incarcerated.  17.Tobacco abuse.   DISCHARGE MEDICATIONS:  1. Levothyroxine.  2. Hydrochlorothiazide.  3. Trazodone.  4. Citalopram.  5. Nexium.  6. Nitrofurantoin.  7. Allegra.  8. Fluconazole 400 mg p.o. daily x7 days.  9. Augmentin 875 mg p.o. b.i.d. x10 days.   ALLERGIES:  SULFA.   HOSPITAL COURSE:  [name] is a pleasant 45 year old morbidly obese  female who was admitted for cellulitis of her panniculus with a slight  tiny ulceration to the left of her umbilicus.  She noted redness 2 days  prior to admission and I [facility].  Given her history of  numerous infections in the  past and her diabetes, morbid obesity and  smoking history, because of those issues, I admitted her with IV Zosyn  and Fluconazole.  I did get a urinalysis given her history of chronic  urinary tract infections but it just grew multiple organisms, nothing  too specific.  After the third hospital day of IV antibiotics, her  redness had decreased.  Her pain had gone away.  There was no more heat  with it anymore.  While there was still erythema involving a decent  amount of her panniculus, it had markedly decreased in intensity and was  becoming more intermittent, spotty and mottled and had decreased  overall.   Based on her improvement it was felt it would be reasonable to discharge  her home on oral antibiotics with close followup with the following  instructions.   DISCHARGE INSTRUCTIONS:  1. She was to return to clinic to see me in 2 to 3 days for close      followup.  2. She should call if she has a fever greater than 101.5, worsening  pain, redness, fever, or other concerns.  If that happens then she      will need to be readmitted and back on IV antibiotics with      reculture.  3. She should do triple antibiotic cream as well as gauze over the      open ulceration on her umbilicus to keep that area protected and      healed up as it probably is the source of the cellulitis.  4. Ventral hernia is stable.  I would not intervene and treat that at      this time, although she may benefit from laparoscopic ventral      hernia repair at a later time but she needs to be infection-free      for at least 6 if not 12 months, provided she does not have      persistent wound issues.  5. Followup of ovarian cancer per Dr. Darrold Span and Dr. Serita Kyle      recommendations.  6. Other medical issues stable at this time.      Ardeth Sportsman, MD  Electronically Signed     SCG/MEDQ  D:  09/09/2008  T:  09/10/2008  Job:  [id number]   cc:   Gregary Signs A. Everardo All, MD  [address]   [place]  [place] [address]   Lennis P. Darrold Span, M.D.  Fax: [phone]   Phil D. Okey Dupre, M.D.

## 2010-08-19 NOTE — Group Therapy Note (Signed)
[name], [name]               ACCOUNT NO.:  [id number]   MEDICAL RECORD NO.:  [id number]          PATIENT TYPE:  WOC   LOCATION:  [facility]                   FACILITY:  [facility]   PHYSICIAN:  Ginger Carne, MD DATE OF BIRTH:  [birth year]   DATE OF SERVICE:  08/18/2007                                  CLINIC NOTE   Ms. Kist returns today following total abdominal hysterectomy  bilateral salpingo-oophorectomy, omentectomy and peritoneal washings  from Aug 10, 2007.  She had a large pelvic mass which was, on final  pathology report, a left ovarian endometrial adenocarcinoma well  differentiated.  The right tube and ovary was normal as was the omentum  portions of adipose tissue, and no evidence of intra uterine pathology.  She is staged as a 1A.  The patient is already scheduled to be followed  up with an Dagmar Hait and Dr. Serita Kyle for followup.  The  patient's incision is dry, clean.  However, I am not comfortable  removing staples and sutures at this time for fear of wound separation  due to her obesity of 314 pounds.  She will return in 1 week for  followup.  At that time, said staples and sutures will be removed.  The  patient was advised as to her diagnosis.  She will continue on Keflex  500 mg twice a day for another week.  Her incision is dry with slight  oozing from the edges of the incisions.           ______________________________  Ginger Carne, MD     SHB/MEDQ  D:  08/18/2007  T:  08/18/2007  Job:  [id number]

## 2010-08-19 NOTE — Group Therapy Note (Signed)
[name], [name]               ACCOUNT NO.:  [id number]   MEDICAL RECORD NO.:  [id number]           PATIENT TYPE:   LOCATION:  [facility]                     FACILITY:   PHYSICIAN:  Ginger Carne, MD      DATE OF BIRTH:   DATE OF SERVICE:  07/20/2007                                  CLINIC NOTE   This patient is a 45 year old obese female who was referred by [facility] because of a large pelvic mass noted on CT of the pelvis to be  16.5 x 17.7 x 16.8 cm.  The mass appears on CT to be compatible with a  leiomyomatous uterus.  The patient states she has had increasing pelvic  pain over the past several months.  Her last menstrual period stopped  eight months ago.  She has not had any bleeding since.  Cycles were  every 28 days lasting approximately four days prior to that time.  Patient has no GI or GU symptomatology.  She also denies musculoskeletal  discomfort.   OBSTETRICAL/GYNECOLOGICAL HISTORY:  The patient has had two pregnancies  in the past, one resulting in a normal vaginal delivery in 1984 and an  ectopic pregnancy in January of 1998 managed by a laparotomy with an  inadvertent cystotomy in [name], [place].   CURRENT MEDICATIONS:  1. Nexium 40 mg daily.  2. Synthroid 250 mcg daily.  3. Toradol 10 mg daily.  4. Hydrochlorothiazide 25 mg daily.  5. Celexa 40 mg daily.  6. Hydrocodone 7.5/750 daily.  7. Trazodone, dose unknown.   ALLERGIES:  SULFA.   PAST MEDICAL HISTORY:  1. Hypertension.  2. Acid reflux.  3. Anxiety.  4. Depression.  5. Chronic back pain.  6. Hypothyroidism.  7. Morbid obesity.   PAST SURGICAL HISTORY:  Patient had a laparotomy in January of 1998 for  an ectopic.  She is unsure of the side, and had an inadvertent  cystotomy.  Patient also had a laparoscopic cholecystectomy in 2005, and  a tonsillectomy and adenoidectomy.   FAMILY HISTORY:  Her mother has diabetes, hypertension, has had a heart  attack, coronary artery  heart disease, lung cancer.  Also had apparently  a lower extremity thrombophlebitis prior to diagnosis of her lung  cancer.   SOCIAL HISTORY:  Patient does not work outside the home  Smokes 1/2 pack  of cigarettes daily.  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   REVIEW OF SYSTEMS:  Fourteen point comprehensive review of systems  within normal limits.   PHYSICAL EXAMINATION:  Blood pressure is 131/71, weight 342 pounds,  height 5 feet 7 inches.  HEENT:  Grossly normal.  BREASTS:  Exam without mass or discharge, thickenings or tenderness.  CHEST:  Clear to percussion, auscultation.  CARDIOVASCULAR:  Exam without murmurs or enlargements.  Regular rate and  rhythm.  EXTREMITY/LYMPHATIC/SKIN/NEUROLOGICAL/MUSCULOSKELETAL SYSTEMS:  Within  normal limits.  ABDOMEN:  Reveals gross hepatosplenomegaly with a central infraumbilical  incision.  PELVIC EXAM:  Patient had a normal Pap smear in February  of 2009.  External genitalia demonstrates condyloma.  Pelvic exam is difficult to  evaluate due to the patient's obesity.  An endometrial biopsy was  performed.  Scant tissue obtained and sounded to 8 cm.   IMPRESSION:  Pelvic mass.  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   PLAN:  Patient will undergo a laparotomy with probable total abdominal  hysterectomy, bilateral salpingo-oophorectomy and placement of uretero  catheters and cystoscopy.  Ashby Dawes of said procedure discussed in detail.  Risks, including possible injuries to ureter, bowel and bladder,  possible hemorrhage or chronic blood transfusion, incisional dehiscence,  infection, cuff cellulitis, postoperative thrombophlebitis resulting in  a pulmonary embolism, were discussed and understood by said patient.  She is cardiac classification of 1 based on [place] Cardiac  Index.  All questions answered to the satisfaction of said patient.  She  will receive  preoperative Lovenox prior to surgery, 40 mg.           ______________________________  Ginger Carne, MD     SHB/MEDQ  D:  07/20/2007  T:  07/20/2007  Job:  [id number]

## 2010-08-19 NOTE — Discharge Summary (Signed)
  NAMELONEY, [name]               ACCOUNT NO.:  [id number]  MEDICAL RECORD NO.:  [id number]           PATIENT TYPE:  I  LOCATION:  1528                         FACILITY:  [facility]  PHYSICIAN:  Ardeth Sportsman, MD     DATE OF BIRTH:  07/28/1965  DATE OF ADMISSION:  05/30/2010 DATE OF DISCHARGE:  06/01/2010                              DISCHARGE SUMMARY   PRIMARY CARE PHYSICIAN:  Vikki Ports A. Felicity Coyer, MD and Laurette Schimke, MD  DIAGNOSIS:  Ventral hernia.  PROCEDURE PERFORMED:  Laparoscopic ventral hernia repair with mesh and excision of hernia sac on May 30, 2010.  OTHER DIAGNOSES: 1. Ovarian cancer. 2. Morbid obesity. 3. Hypothyroidism. 4. Hypertension. 5. Diabetes. 6. Anxiety. 7. Hepatitis B positive. 8. Chronic back pain. 9. Depression. 10.Gastroesophageal reflux disease.  Procedure performed was laparoscopic lysis of adhesion and underwent ventral hernia repair with open excision of redundant macerated skin and hernia sac on May 30, 2010.  SUMMARY OF HOSPITAL COURSE:  [name] is morbidly female who required a surgical resection of a large ovarian cancer and developed bowel obstruction, wound infection, and chronic panniculitis, requiring numerous surgeries and debridements.  She recovered from that.  She underwent chemotherapy __________ for her ovarian cancer.  However, she developed incisional hernia.  She underwent repair for this.  Postoperatively, she was placed on IV pain medications and On-Q pump. By the time of discharge, she was walking well, tolerating p.o. and adequate pain control.  Therefore, __________discharge to home with the following instructions. 1. She is return to clinic to see me in 1 to 2 weeks. 2. She should remove her On-Q pump when it fully collapses in 1 to 2     days. 3. She should shower every day, keep her dressings clean and dry, and     remove all dressings when she removes the On-Q pump in 1 or 2 days. 4. She should call  if she has worsening fever, chills, sweat, nausea,     vomiting, uncontrolled pain, diarrhea.     Ardeth Sportsman, MD     SCG/MEDQ  D:  08/04/2010  T:  08/05/2010  Job:  [id number]  Electronically Signed by Karie Soda MD on 08/19/2010 12:03:04 PM

## 2010-08-19 NOTE — Group Therapy Note (Signed)
[name], [name]               ACCOUNT NO.:  [id number]   MEDICAL RECORD NO.:  [id number]          PATIENT TYPE:  WOC   LOCATION:  [facility]                   FACILITY:  [facility]   PHYSICIAN:  Ginger Carne, MD DATE OF BIRTH:  05-30-1965   DATE OF SERVICE:  08/25/2007                                  CLINIC NOTE   [name] returns today for removal of sutures and staples.  She is a  312 pound, 141 kg female who on Aug 10, 2007, underwent a total abdominal  hysterectomy, bilateral salpingo-oophorectomy and omentectomy for a  stage IA left ovarian endometrioid adenocarcinoma, well-differentiated.  At the time of surgery Dr. Serita Kyle was directly spoken to and  had agreed that due to the patient's size, doing periaortic and pelvic  node dissection would not be advisable.  The patient is scheduled for  follow-up with their office on Aug 30, 2007, for consideration of  chemotherapy.  At the time of surgery, apart from lymph node dissection  there was zero tumor volume remaining.  Upon removal of staples and  sutures, the incision below the umbilicus was open; however, the fascia  was intact.  The patient was packed with iodoform gauze and home health  care was arranged to begin calcium alginate packings on a daily basis.  Levaquin 500 mg daily for 2 weeks was prescribed and Percocet 5/325 mg  one to two every 4-6 hours was reordered.  On the left side of the  incision the patient has a superficial cellulitis but no hematoma  formation can be elicited, and there is no suggestion of a seroma or  abscess collection.  The patient is afebrile today.   The patient will return in 2 weeks, continue her medications as  prescribed, home health care will be ordered on a daily basis with  calcium alginate packings, and she will see GYN oncology in 5 days.  The  patient will return to this office in 2 weeks.           ______________________________  Ginger Carne, MD     SHB/MEDQ  D:  08/25/2007  T:  08/25/2007  Job:  [id number]

## 2010-08-19 NOTE — H&P (Signed)
[name], [name]               ACCOUNT NO.:  [id number]   MEDICAL RECORD NO.:  [id number]          PATIENT TYPE:  INP   LOCATION:  9309                          FACILITY:  [facility]   PHYSICIAN:  Ginger Carne, MD  DATE OF BIRTH:  July 27, 1965   DATE OF ADMISSION:  09/09/2007  DATE OF DISCHARGE:                              HISTORY & PHYSICAL   REASON FOR HOSPITALIZATION:  Postoperative subcutaneous wound dehiscence  with fascial integrity.   HISTORY OF PRESENT ILLNESS:  This patient is a 45 year old Caucasian  female who had undergone a total abdominal hysterectomy, bilateral  salpingo-oophorectomy, omentectomy, and peritoneal washings on Aug 10 2007, because of a large pelvic mass.  Pathology revealed left ovarian  endometrioid carcinoma.  She was staged as IA.  Dr. Serita Kyle  was consulted intraoperatively and due to the patient's significant  obesity, it was determined that a pelvic and periaortic lymph node  dissection would not be in the safe interest, and it was deferred.  The  patient had an unremarkable postoperative course and on Aug 25, 2007,  returned to the gyn clinic for staple removal.  Her would had underwent  a dehiscence; however, there was intact fascia.  Evaluation also  demonstrated a left lower abdominal wall cellulitis which was firm and  hard and measured about 15 x 15 cm.  The patient was placed at that  point on Keflex and had returned on the 29th of May 2009 after home  health care calcium Alginate packings with foul-smelling discharge and  concern about the possibility of necrotizing fasciitis.  An immediate  surgical consultation was obtained, and Dr. Freida Busman had determined from  the surgery department that the patient indeed did not have necrotizing  fasciitis.  Wound culture demonstrated no growth of staph aureus or  group A strep.   The patient was also evaluated by the wound care specialists, and  dressings were changed from calcium alginate  to wet-to-dry.  She had  been initially started on vancomycin, and Zosyn was added to her  antibiotic regimen during her hospitalization.  Her left side of the  incision appeared to be less tender, although it was not completely  nontender, but the patient had felt that she saw improvement as did the  general surgeon and this gynecologist.  The wound proper demonstrated  good granulation tissue, and it was determined to discharge the patient  with doxycycline 100 mg twice daily.  She was discharged on September 08, 2007.  During her hospitalization between May 29 and June 4, she had 2  debridements of her wound.  At all times, the fascia was intact.  The  patient was afebrile also at all times.   On the morning of this admission, the home health care nurse was  concerned and contacted this office because the patient complaining of  increasing left-sided discomfort as well as increasing drainage of the  wound.  Inspection reveals a darkened area which was previously noted in  the left side of the incision which now measures approximately 4 cm.  This is somewhat necrotic.  At the time of exploration of the wound and  her previous admission, the left side demonstrated no evidence of  loculations or abscess formation.  The granulation tissue was still  present in the wound proper; however, there is eschar also noted.  It  was determined that at this point, the best option would be to admit the  patient for further management and to obtain surgical consultation as  well.   OB/GYN HISTORY:  The patient has had 2 vaginal deliveries in the past  and an ectopic pregnancy in 1998 managed by a laparotomy with an  inadvertent cystotomy.   CURRENT MEDICATIONS:  1. Nexium 40 mg daily.  2. Synthroid 250 mcg daily.  3. Toradol 10 mg p.r.n. daily.  4. Hydrochlorothiazide 25 mg daily.  5. Trazodone 50 mg daily.  6. Levothyroid 150 mcg daily.  7. Fexofenadine 100 mg daily.  8. Colace 100 mg twice  daily.   ALLERGIES:  SULFA.   PAST MEDICAL HISTORY:  1. Hypertension.  2. Acid reflux.  3. Anxiety/depression.  4. Chronic back pain.  5. Hypothyroidism.  6. Morbid obesity.   PAST SURGICAL HISTORY:  1. Total abdominal hysterectomy/bilateral salpingo-oophorectomy,      omentectomy, peritoneal washings in Aug 10, 2007, with stage IA      endometrioid left ovarian carcinoma.  Tumor volume was grossly 0      postoperatively.  2. In January 1998, the patient had an ectopic pregnancy with an      inadvertent cystotomy.  3. She has also had a laparoscopic cholecystectomy in 2005.  4. Tonsillectomy and adenoidectomy.   FAMILY HISTORY:  Her mother had diabetes type 2, hypertension, has had a  myocardial infarction, coronary artery disease, lung cancer.   SOCIAL HISTORY:  The patient does not work outside the home, smokes 1/2  pack of cigarettes daily.  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   REVIEW OF SYSTEMS:  Fourteen-point comprehensive review of systems  within normal limits.   PHYSICAL EXAMINATION:  VITAL SIGNS:  Per chart record:  Weight 342  pounds, height 5 feet 7 inches.  HEENT:  Grossly normal.  BREAST EXAM:  Without mass, discharge, thickenings, or tenderness.  CHEST:  Clear to percussion and auscultation.  CARDIOVASCULAR:  Without murmurs or enlargements, regular rate and  rhythm.  EXTREMITIES/LYMPHATICS/SKIN/NEUROLOGIC/MUSCULOSKELETAL:  Within normal  limits.  ABDOMEN:  Demonstrates an opened infraumbilical wound to the fascia  which is intact.  There is eschar noted, particularly on the left side  of the wound; however, there is also good granulation tissue without  evidence of obvious purulence.  The left side of the incision had a 15 x  15 cm tender, firm, hardness which is consistent with cellulitis in  addition to erythema.  There is a blackened area approximately 4  cm that  is circular and demonstrates evidence of abscess formation in the middle  of this mass.  PELVIC:  Deferred.   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   PLAN:  The patient will be readmitted, wound packings, surgical  consultation, placement on vancomycin and Zosyn, and consideration of  further surgical debridement.      Ginger Carne, MD  Electronically Signed     SHB/MEDQ  D:  09/09/2007  T:  09/09/2007  Job:  [id number]

## 2010-08-19 NOTE — Consult Note (Signed)
NAMEANBERLYN, [name]               ACCOUNT NO.:  [id number]   MEDICAL RECORD NO.:  [id number]          PATIENT TYPE:  WOC   LOCATION:  WOC                          FACILITY:  [facility]   PHYSICIAN:  [name], M.D.    DATE OF BIRTH:  16-Nov-1965   DATE OF CONSULTATION:  01/19/2008  DATE OF DISCHARGE:                                 CONSULTATION   CHIEF COMPLAINT:  This 45 year old woman is seen at the request of Dr.  Mia [name] for  recommendations regarding further management.   HISTORY OF PRESENT ILLNESS:  This patient presented with a large pelvic  mass on CT, thought to be a leiomyomatous uterus.  She was explored on  May 6, and washings were obtained when this was determined to be a large  ovarian tumor.  This measured approximately 18 cm and was removed  intact.  There were no adhesions or rupture.  Washings histologically  were negative and final pathology revealed an ovarian endometrioid  adenocarcinoma, well-differentiated, involving the left ovary with  negative uterine serosa, right ovary and omentum.  It should be noted  that endosalpingiosis and endometriosis were noted from the  contralateral adnexa and resection of pelvic adipose tissue.  The  patient's postoperative course has been slowed because of a wound  infection that is being treated with visiting nurse assistance.  She is  on antibiotics and healing slowly from surgery.   PAST MEDICAL HISTORY:  The patient is morbidly obese with hypertension,  GERD, anxiety, depression, chronic back pain, hypothyroidism, prior NSVD  and ectopic pregnancy delivered via laparotomy, laparoscopic  cholecystectomy, remote T&A.   FAMILY HISTORY:  Significant for family members with diabetes and  hypertension.  The patient's sister had breast cancer diagnosed in her  58s and mother has terminal lung cancer.   PERSONAL AND SOCIAL HISTORY:  The patient is a smoker and in the past  admits to ethanol and illicit drug use but claims none  used over the  past 11 years.   REVIEW OF SYSTEMS:  The patient has residual pain and some constipation  following surgery.  She is beginning to ambulate more.  Other than acute  postoperative issues, negative in a comprehensive review.   PHYSICAL EXAMINATION:  VITAL SIGNS:  Stable and afebrile.  GENERAL:  The patient is obese and in no acute distress, alert and  oriented x3.  LYMPH:  No pathologic lymphadenopathy.  BACK:  No spinous or CVA tenderness.  ABDOMEN:  Large pannus.  Midline incision is packed open and there is  some surrounding erythema.  There is a slight malodorous discharge but  the wound has not been changed since yesterday.  Visiting nurses are  coming to the home later today.  PELVIC:  Examination is deferred.   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  This was an extremely large lesion, and there is  approximately a 20% risk of occult metastasis.  Given her obesity and  wound infection, I would not advocate comprehensive staging because of  its inherent morbidity.  Rather, I would recommend  chemotherapy.  Because of her young age, I would consider 6 cycles of Taxol and  carboplatin chemotherapy.  I would back down if the patient were  experiencing significant toxicity after 4 cycles but in general would  push to  complete cycles in this patient or similar patients.  I had a lengthy  conversation with the patient and her husband, answered multiple  questions, and the patient is set up for the first consultation with Dr.  Darrold [name] within the next couple of weeks.      [name], M.D.  Electronically Signed     JTS/MEDQ  D:  March 11, 202009  T:  March 11, 202009  Job:  [id number]   cc:   [name] Carne, MD   Telford Nab, R.N.  [address]  [place], [place] [address]   [name] Rodriguez, FNP  [facility]

## 2010-08-19 NOTE — Consult Note (Signed)
[name], [name]               ACCOUNT NO.:  [id number]   MEDICAL RECORD NO.:  [id number]          PATIENT TYPE:  OUT   LOCATION:  GYN                          FACILITY:  [facility]   PHYSICIAN:  John T. Kyla Balzarine, M.D.    DATE OF BIRTH:  November 02, 1965   DATE OF CONSULTATION:  10/31/2008  DATE OF DISCHARGE:                                 CONSULTATION   CHIEF COMPLAINT:  Followup of ovarian cancer.   HISTORY OF PRESENT ILLNESS:  This patient was explored in May 2009 for  pelvic mass and was found to have an endometrioid ovarian carcinoma,  grade 1 involving the left ovary with negative serosa, right ovary and  omentum.  Endometriosis was noted from the contralateral adnexa and  pelvic tissues.  Preoperative CA-125 value was 94.5 and normalized  postoperatively.  The patient had severe morbidity from her abdominal  incision, requiring several hospitalizations and debridement procedures.  She completed six cycles of Taxol and carboplatin through 03/2008.  CA-  125 values have remained less than 28, with most recent value 11.1 on  July 12.  She notes a pulling discomfort in her right lower quadrant  along her surgical incision but denies nausea or vomiting, obstructive  symptoms, bloating, or early satiety.  She has been relatively inactive  and has gained weight dramatically since completing chemotherapy.   PAST MEDICAL HISTORY:  1. Morbid obesity.  2. Hypertension.  3. GERD.  4. Anxiety and depression.  5. Chronic low back pain.  6. Hypothyroidism.  7. Prior NSVD and ectopic pregnancy treated with laparotomy.  8. Laparoscopic cholecystectomy.  9. Remote T&A.  10.Ovarian cancer therapy as above.   FAMILY HISTORY:  The patient's sister had breast cancer diagnosed in her  30s and her mother had lung cancer.   PERSONAL AND SOCIAL HISTORY:  The patient remains a smoker and admits to  ethanol and illicit drug use in the past but claims none on the past 11  years.   CURRENT  MEDICATIONS:  1. Nexium.  2. Allegra.  3. Celexa.  4. Hydrochlorothiazide.  5. Trazodone.  6. Synthroid.  7. Vicodin p.r.n.Marland Kitchen   ALLERGIES:  SULFA causes hives.   PHYSICAL EXAMINATION:  VITAL SIGNS:  Stable and afebrile.  As recorded  in clinical chart with weight of 340 pounds.  GENERAL:  The patient is obese and in no acute distress, oriented x3.  LYMPH:  No evidence of pathologic lymphadenopathy.  ABDOMEN:  Obese, soft and benign with depressed scars.  There is a large  a periumbilical hernia measuring at least 4 cm, easily reducible and  slightly tender  BACK:  No spinous or CVA tenderness.  EXTREMITIES:  No edema, cords or Homan's.  PELVIC:  External genitalia and BUS, bladder and urethra normal.  Vaginal mucosa is normal.  Absent uterus and cervix.  No palpable mass,  nodularity or tenderness on bimanual and rectovaginal examinations.   LABORATORY DATA:  CT of abdomen and pelvis reviewed with the patient and  unchanged, with complex stable mass in the left kidney and lymph nodes  up to 1 cm unchanged.  No carcinomatosis, visceral metastasis or  ascites.  CA-125 values above.   ASSESSMENT:  Ovarian carcinoma, NED.   PLAN:  The patient should continue followup at 59-month intervals.  She  is morbidly obese and has a ventral hernia.  When she is out from  surgery a couple of years, I would recommend that she see a bariatric  surgery surgeon for consideration of gastric surgery and ventral hernia  repair.  The patient was encouraged to lose weight and exercise more.      John T. Kyla Balzarine, M.D.  Electronically Signed     JTS/MEDQ  D:  10/31/2008  T:  10/31/2008  Job:  [id number]   cc:   Lennis P. Darrold Span, M.D.  Fax: [phone]   Telford Nab, R.N.  [address]  [place], [place] [address]   Gregary Signs A. Everardo All, MD  [address]  [place]  [place] [address]   Ardeth Sportsman, MD  [address] [place] [address]

## 2010-08-19 NOTE — Discharge Summary (Signed)
NAMELISSIE, [name]               ACCOUNT NO.:  [id number]   MEDICAL RECORD NO.:  [id number]         PATIENT TYPE:  LINP   LOCATION:                               FACILITY:  [facility]   PHYSICIAN:  Adolph Pollack, M.D.DATE OF BIRTH:  [birth year]   DATE OF ADMISSION:  10/24/2007  DATE OF DISCHARGE:  10/31/2007                               DISCHARGE SUMMARY   FINAL DIAGNOSIS:  Recurrent panniculitis.   SECONDARY DIAGNOSES:  1. Citrobacter urinary tract infection.  2. Type 2 diabetes mellitus.  3. Hypertension.  4. Morbid obesity.  5. Hypothyroidism.  6. Anxiety disorder.  7. Hepatitis B.  8. Chronic low back pain.  9. Endometrioid type ovarian cancer.   PROCEDURE:  Incision and debridement of recurrent panniculitis October 25, 2007.   REASON FOR ADMISSION:  Ms. Hartl is a 45 year old female with a complex  wound history.  She had a TAH-BSO for stage I ovarian cancer earlier  this year, and then had wound problems requiring debridements by Dr.  Michaell Cowing for panniculitis.  She was discharged into the care of Dr. Michaell Cowing  on vacuum-assisted wound closure therapy.  She had the vac removed the  week prior to her admission, and then she began having high fever, wound  pain, firmness, and redness on the right side of the wound and came to  the emergency department and then came to the emergency department,  where I was asked to see her.  On examination she had some induration  and cellulitis lateral to the open aspect of the lower transverse wound  as well as pyuria and leukocytosis.  She subsequently was admitted.   HOSPITAL COURSE:  She was admitted and placed on vancomycin and Zosyn.  Urine culture was sent.  The cellulitis actually spread in 24 hours, and  she was taken to the operating room where she underwent debridement of  basically full-thickness skin panniculitis.  Also of note was that her  urine culture came back for Citrobacter UTI that was sensitive to Cipro,  and this was  added to her antibiotic regimen.  She started on wet-to-dry  dressing changes.  She defervesced.  Her type 2 diabetes is well  controlled with sliding scale insulin.  She was tolerating her wound  care and cellulitis was decreasing.  Subsequently I tried to switch her  to oral Percocet which she tolerated.  By October 31, 2007 she felt good.  She did not want the vac therapy replaced.  She is afebrile.  The  cellulitis was resolved, and she is tolerating the damp-to-dry dressing  changes.  She is on Cipro for UTI and was able to be discharged.   DISPOSITION:  Discharged to home October 31, 2007.  Home health nursing  will be arranged for daily normal saline damp-to-dry dressing change.  She is given Percocet and ciprofloxacin, and told to resume her home  medicines.  She will follow up in the office with Dr. Michaell Cowing on August 4.      Adolph Pollack, M.D.  Electronically Signed     TJR/MEDQ  D:  11/15/2007  T:  11/15/2007  Job:  [id number]   cc:   Ginger Carne, MD

## 2010-08-19 NOTE — Consult Note (Signed)
[name], [name]               ACCOUNT NO.:  [id number]   MEDICAL RECORD NO.:  [id number]          PATIENT TYPE:  INP   LOCATION:                                FACILITY:  [facility]   PHYSICIAN:  Ardeth Sportsman, MD     DATE OF BIRTH:  May 23, 1965   DATE OF CONSULTATION:  DATE OF DISCHARGE:  08/25/2007                                 CONSULTATION   REQUESTING PHYSICIAN:  Ginger Carne, MD.   SURGEON:  Ardeth Sportsman, MD.   REASON FOR CONSULTATION:  Recurrent cellulitis and pain of panniculus,  status post wound infection from prior hysterectomy.   HISTORY OF PRESENT ILLNESS:  [name] is a 45 year old morbidly obese  female who was found to have a left ovarian endometrioid carcinoma.  On  Aug 10, 2007, she underwent abdominal hysterectomy, bilateral salpingo-  oophorectomy, omentectomy, and peritoneal washings for a large left  pelvic mass.  Postoperatively, she developed a wound infection and skin  opened up.  She was brought back in the hospital and placed on IV  antibiotics.  This was done on the Sep 02, 2007.  Surgical consultation  was requested, and Dr. Mia Creek did follow with patient with Gynecology  for concerns of the large wound.  Debridements were done a few times and  seemed to clear up.  She was on IV antibiotics, and her white count  normalized.  She was sent home on oral antibiotics on September 08, 2007.  She  came back to hospital yesterday, less than a day after being discharged  complaining of worsening pain in her left panniculus and foul drainage.  The wound care nurses felt uncomfortable managing the wound.  She had  been tried on a wound VAC, but apparently there was too much purulence  for her to tolerate it and pain.   BASIC CONCERNS:  Dr. Mia Creek requested surgical consultation, and I was  available.   PAST MEDICAL HISTORY:  1. Morbid obesity.  2. Hypertension.  3. Gastroesophageal reflux disease.  4. Anxiety.  5. Depression.  6. Chronic back  pain.  7. Hypothyroidism.  8. Stage IA endometrioid carcinoma of  the left ovary.   PAST SURGICAL HISTORY:  1. As noted above with recent abdominal hysterectomy, bilateral      salpingo-oophorectomy, omentectomy, and peritoneal washings on Aug 10, 2007.  2. Ectopic pregnancy with cystostomy in 1998.  3. Laparoscopic cholecystectomy in 2005.  4. Tonsillectomy and adenoidectomy.   SOCIAL HISTORY:  She smokes about half a pack of cigarettes a day.  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  She works at home.   FAMILY HISTORY:  Diabetes type 2, hypertension, myocardial infarction,  and coronary artery disease in her mother.  Lung cancer in her mother as  well.   REVIEW OF SYSTEMS:  As noted per HPI.  CONSTITUTIONAL:  No fevers,  chills, sweats, or recent change in her weight.  OPHTHALMOLOGIC, ENT,  CARDIAC, and RESPIRATORY:  Negative.  GI:  No nausea or vomiting.  No  worsening diarrhea or severe intra-abdominal pain.  GU:  She has had  little bit of foul smell to her urine, but no definite UTI that she can  recall.  No UTI, no vaginal bleeding or discharge.  MUSCULOSKELETAL:  Some mild major joint soreness, but not recently changed.  NEUROLOGICAL:  Negative.  HEME/LYMPH:  Negative.  IMMUNOLOGIC:  Negative.  PSYCH:  Stable.   PHYSICAL EXAMINATION:  VITAL SIGNS:  Temperature yesterday was 99.3,  pulse 95, respirations 20, blood pressure 98/57, and 94% on room air.  GENERAL:  She is a well-developed, super-obese female with fair hygiene  in bed, and comfortable enough , not frankly toxic.  PSYCH:  She is pleasant and interactive.  No evidence of any dementia,  delirium, psychosis, or paranoia.  HENT:  No facial asymmetry.  Mucus membranes moist.  Naso/oropharynx  clear.  Eyes: pupils are equal, round, and reactive to light.  Extraocular  movements intact.  Sclerae anicteric, noninjected.  NECK:  Supple without any  masses.  Trachea is midline.  HEART:  Regular rate and rhythm.  No murmurs, gallops, or rubs.  CHEST:  Clear to auscultation bilaterally.  No wheezes, rales, or  rhonchi.  ABDOMEN:  Morbidly obese.  The upper part is soft.  She has  infraumbilically an open wound that is about 20 cm in length and goes  down about 15 cm.  The right side of her panniculus has excellent  granulation tissue.  I can see a single fascial stitch.  The fascia  appears intact with no obvious hernia.  The left side has about 50%  granulation of her panniculus, but she has undermining, going about 8-10  cm that is exquisitely tender with necrosis.  She had some skin ischemia  necrosis as well on the left panniculus.  She has redness, tenderness,  and heat, and going about 15 x 40 cm involving pretty much of the left  side of her panniculus as well.  I did not do rectal or pelvic  examination.  Please note the left panniculus has firm induration about  15 x 20 cm in size.  SKIN: Otherwise no other significant sores or lesions.   ASSESSMENT AND PLAN:  1. Worsening tenderness and cellulitis.  On most recent intravenous      antibiotics, oral antibiotics and bedside debridements, worsening.      Agree with intravenous Zosyn and intravenous vancomycin to      intraoperative debridement.  She is too tender to really adequately      assess the wound and has significant undermining but I think wound      care has failed on an outpatient basis.  She is neither too tender      to try and do bedside debridement.  Her white counts are not too      severe, but I think the operative debridement would be better so      that I can more adequately explore the wound and clean it up.  The      risks, benefits, and alternatives were discussed, and the patient      agrees to proceed.  Dr. Mia Creek wishes to be available for the      case as well.  2. CT scan of the abdomen and pelvis to make sure there is no      intraabdominal  abscesses and bowel obstruction or other etiology      for some of her discomfort.  It would also be helpful to see if her      panniculus has any phlegmon or any other potential source of      abscess or deep inflammation that could explain some of her      discomfort.      Ardeth Sportsman, MD  Electronically Signed    SCG/MEDQ  D:  09/09/2007  T:  09/10/2007  Job:  [id number]

## 2010-08-19 NOTE — Op Note (Signed)
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   PROCEDURES:  Total abdominal hysterectomy, bilateral salpingo-  oophorectomy, omentectomy, cystoscopy with placement of bilateral  ureteral catheters, and peritoneal washings.   SURGEON:  Ginger Carne, M.D.   ASSISTANT:  Vela Prose A. Silas Flood, M.D.   ANESTHESIA:  General.   ESTIMATED BLOOD LOSS:  1500 mL.   COMPLICATIONS:  None immediate.   SPECIMEN:  Uterus, cervix, right and left tube and ovaries, and omentum  to pathology.   OPERATIVE FINDINGS:  Upon opening the abdomen, a 20-cm mass was noted  that was on first inspection attached to the posterior aspect of the  uterus and encompassed the entire pelvic cavity.  After blunt and sharp  dissection, the mass was removed and found to be the left ovary and  tube.  The left tube was previously excised and the left ovary was not  adherent to the uterus. The surface was smooth, no tumor on the external  surface noted, intact capsule and no rupture occurred intraoperatively.  This was sent for frozen section at which time the pathology report  returned as a well-differentiated left adenocarcinoma of the ovary.  The  right tube and ovary appeared normal.  The uterus and cervix appeared  normal in size and appearance.  No ascites was noted. The omentum had no  obvious lesions, and the large and small bowel were run in its entirety  and found to be grossly normal.  The subdiaphragmatic regions  appeared  smooth without lesions.  All peritoneal surfaces were inspected and  found to be free of lesions.  The liver edge was smooth.  Upon  palpation, the pelvic and paraaortic nodal regions demonstrated no  evidence of obvious adenopathy.   OPERATIVE PROCEDURE:  The patient was prepped and draped in the usual  fashion and placed in lithotomy position.  Betadine solution used for  antiseptic, and the patient was catheterized after placement of ureteral  catheters (#5) with the aid of cystoscopy and ureteral guidewires.  Afterwards, Foley was introduced and the catheters were drained inside  the Foley catheter.   A vertical infraumbilical incision was made, which extended above the  umbilicus and the abdomen was opened.  Peritoneal washings obtained.  The said mass of the left ovary was dissected with sharp and blunt  dissection, and after careful identification of both ureteral catheters,  the left infundibulopelvic ligament was clamped, cut, and ligated with 0  Vicryl suture twice.  Specimen sent to pathology for frozen section.  Following this, Dr. De Blanch was contacted by telephone,  the case was discussed.  It was the opinion of Dr. Stanford Breed that  given the patient's weight of about approximately 350 pounds, a pelvic  and paraaortic lymph node dissection should not be performed for patient  safety reasons.  It was agreed that a total abdominal hysterectomy with  bilateral salpingo-oophorectomy, omentectomy, and peritoneal washings  would be appropriate including full inspection of the abdominal and  pelvic contents.  At this point, the round ligaments were clamped, cut,  and ligated with 0 Vicryl suture.  The anterior and posterior peritoneal  leaves were then dissected off the uterus.  The right infundibulopelvic  ligament was clamped, cut, and ligated with 0 Vicryl suture.  Again,  ureters were carefully palpated and identified at all times with the aid  of  the ureteral catheters.  In the standard Clay fashion, the  uterine vasculature was clamped, cut, and ligated with 0 Vicryl suture,  and this was extended down to the junction of the cervix and the vagina.  The corpus and cervix were then removed from the vaginal cuff proper,  angles were closed separately with 0 Vicryl suture and then running 0  Vicryl interlocking suture used to close the cuff.  Bleeding points were  meticulously checked using hemoclips and cautery, where safely  applicable.  As described above, peritoneal washings were obtained.   An omentectomy was performed using 0 Vicryl suture and closed the  transverse colon as safely possible.  Copious irrigation of lactated  Ringer's followed.  No active bleeding noted.  As described above, the  inspection of the entire abdomen, pelvis, and subdiaphragmatic regions  was carried out.  Closure then of the fascia from either end to midline  with double-loop 0 PDS running suture.  A Jackson-Pratt drain was then  placed subcutaneously.  A 2-0 Vicryl was used for subcutaneous closure,  and skin staples and 0 silk suture used for closure of the skin.  The  patient tolerated the procedure well, returned to the post-anesthesia  recovery room in excellent condition.   The ureteral catheters were removed, and Foley catheter removed.  Both  ureteral catheters were intact, and the Foley was then replaced with a  standard 16-French Foley catheter.  Urine had slight pink tinging.      Ginger Carne, MD  Electronically Signed     SHB/MEDQ  D:  08/10/2007  T:  08/11/2007  Job:  [id number]

## 2010-08-22 ENCOUNTER — Ambulatory Visit: Payer: Self-pay

## 2010-08-22 NOTE — H&P (Signed)
[name], [name]                           ACCOUNT NO.:  [id number]   MEDICAL RECORD NO.:  [id number]                   PATIENT TYPE:  INP   LOCATION:  6715                                 FACILITY:  [facility]   PHYSICIAN:  Sharlet Salina T. Hoxworth, M.D.          DATE OF BIRTH:  04-13-1965   DATE OF ADMISSION:  04/25/2003  DATE OF DISCHARGE:                                HISTORY & PHYSICAL   CHIEF COMPLAINT:  Abdominal pain.   HISTORY OF PRESENT ILLNESS:  [name] is a 45 year old white female  with known gallstones.  These were first diagnosed about 4 years ago when  she was having some abdominal complaints.  She has had intermittent self-  limited upper abdominal pain on and off for the past 4 years.  She now,  however, presents to the [facility] Emergency Room with 3 days of persistent  severe epigastric abdominal pain radiating straight through to her  interscapular area of her back.  The pain has waxed and waned but has been  present for about 3 days.  She has had some nausea and vomiting.  She has  had some fever which she has documented as high as 101.  Bowel movements  okay.  No jaundice noted.   PAST MEDICAL HISTORY:   SURGICAL:  1. History of ectopic pregnancy.  2. Tonsillectomy.   MEDICAL:  1. GERD.  2. Hypothyroidism.  3. History of depression, stopped medications due to cost.  4. Morbid obesity.   CURRENT MEDICATIONS:  1. Prilosec p.r.n.  2. Synthroid 250 mcg a day.   ALLERGIES:  She is allergic to SULFA ANTIBIOTICS.   SOCIAL HISTORY:  She is not married, lives with fiance and son.  Not  employed.  Smokes a half a pack of cigarettes per day.  Drinks no alcohol.   FAMILY HISTORY:  Noncontributory.   REVIEW OF SYSTEMS:  Positive fever with this illness.  HEENT:  No  vision/hearing/swallowing problems.  RESPIRATORY:  Some pain with deep  breathing with this illness, otherwise, denies shortness of  breath/wheezing/cough.  CARDIAC:  Denies cardiac history  of chest  pain/palpitations.  GI:  As above.  GU:  Some frequency of urination at  night.   PHYSICAL EXAMINATION:  VITAL SIGNS:  Temperature is 98, pulse 96,  respirations 20, blood pressure is 122/74.  GENERAL:  In general, she is a morbidly obese white female who appears  uncomfortable.  SKIN:  Skin warm and dry without rash or infection.  HEENT:  No palpable mass or thyromegaly.  Sclerae nonicteric.  Nares and  oropharynx clear.  LUNGS:  Lungs clear to auscultation without increased work of breathing.  CARDIAC:  Regular tachycardia.  No peripheral edema.  No murmurs.  ABDOMEN:  Abdomen obese.  Healed low midline incision.  There is marked  epigastric and right upper quadrant tenderness with guarding.  No masses or  organomegaly appreciable.  EXTREMITIES:  No joint  swelling, deformity, cyanosis.  NEUROLOGIC:  Alert and fully oriented.  Motor and sensory exams grossly  normal.   LABORATORIES:  Urinalysis shows trace bilirubin, positive ketones, 3 to 6  white cells.  Pregnancy test negative.  White count elevated at 21,500,  hemoglobin 18.2.  Electrolytes, BUN and creatinine all normal.  LFTs and  lipase all normal.   IMAGING:  Ultrasound of abdomen shows multiple gallstones, no apparent  gallbladder wall thickening, positive sonographic Murphy's sign, common bile  duct not visualized.   ASSESSMENT AND PLAN:  1. Apparent cholelithiasis with acute cholecystitis.  2. Morbid obesity.   PLAN:  The patient will be admitted, begun on IV fluids, antibiotics, pain  medication and we will plan urgent laparoscopic cholecystectomy.                                                Lorne Skeens. Hoxworth, M.D.    Tory Emerald  D:  04/25/2003  T:  04/25/2003  Job:  [id number]

## 2010-08-22 NOTE — Op Note (Signed)
NAMEMIRHA, BRUCATO                           ACCOUNT NO.:  [id number]   MEDICAL RECORD NO.:  [id number]                   PATIENT TYPE:  INP   LOCATION:  6715                                 FACILITY:  [facility]   PHYSICIAN:  Sharlet Salina T. Hoxworth, M.D.          DATE OF BIRTH:  [birth year]   DATE OF PROCEDURE:  04/25/2003  DATE OF DISCHARGE:                                 OPERATIVE REPORT   PREOPERATIVE DIAGNOSIS:  Cholelithiasis and acute cholecystitis.   POSTOPERATIVE DIAGNOSIS:  Cholelithiasis and acute cholecystitis.   OPERATION PERFORMED:  Laparoscopic cholecystectomy.   SURGEON:  Lorne Skeens. Hoxworth, M.D.   ASSISTANT:  Ollen Gross. Carolynne Edouard, M.D.   ANESTHESIA:  General.   INDICATIONS FOR PROCEDURE:  Graciella Arment is a 45 year old white female with  morbid obesity, who presents with a three-day history of persistent  epigastric and back pain. Gallbladder ultrasound has shown multiple  gallstones.  White blood cell count is elevated at 21,000.  She had  significant epigastric and right upper quadrant tenderness.  Laparoscopic  cholecystectomy had been recommended for apparent acute cholecystitis.  The  nature of the procedure, its indications and risks of bleeding, infection,  bile leak, bile duct injury and possible need for open procedure were  discussed and understood.  She is now brought to the operating room for this  procedure.   DESCRIPTION OF PROCEDURE:  The patient was brought to the operating room and  placed in supine position on the operating table and general endotracheal  anesthesia was induced.  She was on preoperative broad spectrum antibiotics.  PAS were in place.  The abdomen was widely sterilely prepped and draped.  A  0.5 incision was made in the made in the midline above the umbilicus and  dissection carried down to the midline fascia which was incised for 1 cm  under direct vision and the peritoneum entered.  Through a mattress suture  of 0 Vicryl, the Hasson  trocar was placed and a pneumoperitoneum  established.  Under direct vision a 10 mm trocar was placed in the  subxiphoid area and two 5 mm trocars on the right subcostal margin.  The  gallbladder was acutely inflamed, tensely distended with exudate.  Needle  aspirator was used to decompress the gallbladder and the fundus was able to  be grasped and elevated.  The liver and gallbladder were not very mobile due  to the patient's size and the acute inflammation.  The fundus was able to be  exposed with a 30 scope and grasped but I could not see the portal hepatis  well.  An additional 10 mm trocar was placed in the upper left abdomen and  the fan retractor used to retract the duodenum to the left giving good  exposure to the hepatoduodenal ligaments and Calot's triangle distal  gallbladder.  There was significant acute inflammation extending right down  into Calot's triangle.  Fibrofatty tissue was stripped down  away from the  porta hepatis.  This dissection progressed very tediously and carefully.  The peritoneum anterior and posterior to Calot's triangle was incised. There  was a lot of induration here as well as acute inflammation.  I was able to  open a window behind the gallbladder at the gallbladder cystic duct  junction.  Further dissection was carried along here and the cystic duct was  identified.  It was quite edematous.  The cystic artery was exposed just  behind the cystic duct and careful dissection was carried down between these  two structures and the cystic duct surrounded at the gallbladder junction.  The cystic artery could be seen in Calot's triangle just behind this and  there was a free window behind the liver, so the anatomy appeared clear that  this was the cystic duct gallbladder junction.  There was, however, no  swelling and induration here but I could not quite get the clip applier  around this. Therefore, we went ahead and divided the cystic duct really  slightly  upon to the gallbladder leaving a cuff we could grasp and then a 2-  0 PDS Endoloop was placed around the cystic duct and secured.  This appeared  to provide good closure of the duct although the tissue itself really was  not good tissue being quite friable and I didn't feel that there was any way  to close this cystic duct any more securely even if the patient were open.  The cystic artery was then further dissected out, clearly seen coursing up  onto the gallbladder and it was divided between proximal and distal clips.  The gallbladder was then dissected free from its bed using hook cautery and  there was acute inflammation throughout.  It was then detached, placed in  EndoCatch bag and brought out through the Hasson trocar site.  The  gallbladder bed was cauterized and complete hemostasis assured.  A closed  suction drain was brought out through one of the lateral trocar sites and  left in Morrison's pouch.  Trocars were removed and all CO2 evacuated from  the peritoneal cavity.  A mattress suture was secured at the supraumbilical  incision.  The skin incisions were closed with interrupted subcuticular 4-0  Monocryl and Steri-Strips.  Sponge, needle and instrument counts were  correct. Dry sterile dressings were applied.  Patient taken to recovery in  satisfactory condition.                                               Lorne Skeens. Hoxworth, M.D.    Tory Emerald  D:  04/25/2003  T:  04/25/2003  Job:  [id number]

## 2010-08-29 ENCOUNTER — Other Ambulatory Visit: Payer: Self-pay | Admitting: Oncology

## 2010-08-29 ENCOUNTER — Encounter (HOSPITAL_BASED_OUTPATIENT_CLINIC_OR_DEPARTMENT_OTHER): Payer: Medicare Other | Admitting: Oncology

## 2010-08-29 DIAGNOSIS — C569 Malignant neoplasm of unspecified ovary: Secondary | ICD-10-CM

## 2010-08-29 LAB — CBC WITH DIFFERENTIAL/PLATELET
BASO%: 0.7 % (ref 0.0–2.0)
EOS%: 1.2 % (ref 0.0–7.0)
MCH: 30.5 pg (ref 25.1–34.0)
MCHC: 33.7 g/dL (ref 31.5–36.0)
NEUT%: 67 % (ref 38.4–76.8)
RBC: 5.3 10*6/uL (ref 3.70–5.45)
RDW: 15.1 % — ABNORMAL HIGH (ref 11.2–14.5)
WBC: 9.9 10*3/uL (ref 3.9–10.3)
lymph#: 2.6 10*3/uL (ref 0.9–3.3)

## 2010-08-29 LAB — COMPREHENSIVE METABOLIC PANEL
ALT: 29 U/L (ref 0–35)
AST: 29 U/L (ref 0–37)
Calcium: 9.7 mg/dL (ref 8.4–10.5)
Chloride: 100 mEq/L (ref 96–112)
Creatinine, Ser: 0.96 mg/dL (ref 0.40–1.20)
Potassium: 4.3 mEq/L (ref 3.5–5.3)
Sodium: 139 mEq/L (ref 135–145)
Total Protein: 7.2 g/dL (ref 6.0–8.3)

## 2010-08-30 ENCOUNTER — Other Ambulatory Visit: Payer: Self-pay | Admitting: Internal Medicine

## 2010-09-05 ENCOUNTER — Ambulatory Visit
Admission: RE | Admit: 2010-09-05 | Discharge: 2010-09-05 | Disposition: A | Discharge status: Home / Self Care | Payer: Medicare Other | Source: Ambulatory Visit | Attending: Oncology | Admitting: Oncology

## 2010-09-05 DIAGNOSIS — Z0000 Encounter for general adult medical examination without abnormal findings: Secondary | ICD-10-CM

## 2010-09-22 ENCOUNTER — Other Ambulatory Visit: Payer: Self-pay | Admitting: Internal Medicine

## 2010-09-22 NOTE — Telephone Encounter (Signed)
Faxed script back to [facility] @ [phone].Marland KitchenMarland Kitchen6/18/12@1 :31pm/LMB

## 2010-10-31 ENCOUNTER — Other Ambulatory Visit: Payer: Self-pay | Admitting: Internal Medicine

## 2010-10-31 NOTE — Telephone Encounter (Signed)
Rx faxed to pharmacy  

## 2010-11-06 ENCOUNTER — Other Ambulatory Visit: Payer: Self-pay | Admitting: Internal Medicine

## 2010-11-14 ENCOUNTER — Other Ambulatory Visit: Payer: Self-pay | Admitting: Internal Medicine

## 2010-12-01 ENCOUNTER — Encounter: Payer: Self-pay | Admitting: Internal Medicine

## 2010-12-01 ENCOUNTER — Ambulatory Visit (INDEPENDENT_AMBULATORY_CARE_PROVIDER_SITE_OTHER): Payer: Medicare Other | Admitting: Internal Medicine

## 2010-12-01 ENCOUNTER — Other Ambulatory Visit (INDEPENDENT_AMBULATORY_CARE_PROVIDER_SITE_OTHER): Payer: Medicare Other

## 2010-12-01 DIAGNOSIS — E119 Type 2 diabetes mellitus without complications: Secondary | ICD-10-CM

## 2010-12-01 DIAGNOSIS — E039 Hypothyroidism, unspecified: Secondary | ICD-10-CM

## 2010-12-01 DIAGNOSIS — H20029 Recurrent acute iridocyclitis, unspecified eye: Secondary | ICD-10-CM | POA: Insufficient documentation

## 2010-12-01 LAB — HEMOGLOBIN A1C: Hgb A1c MFr Bld: 6.5 % (ref 4.6–6.5)

## 2010-12-01 NOTE — Assessment & Plan Note (Signed)
The current medical regimen is effective;  continue present plan and medications. Check lab today Lab Results  Component Value Date   TSH 3.04 07/02/2010

## 2010-12-01 NOTE — Patient Instructions (Signed)
It was good to see you today. Test(s) ordered today. Your results will be called to you after review (48-72hours after test completion). If any changes need to be made, you will be notified at that time. Medications reviewed, no changes at this time. we'll make referral to rheumatology as requested. Our office will contact you regarding appointment(s) once made. Please schedule followup in 4 months, call sooner if problems.

## 2010-12-01 NOTE — Assessment & Plan Note (Signed)
Follows with optho - on steroid drops for same Will refer to rheum as per request

## 2010-12-01 NOTE — Progress Notes (Signed)
Subjective:    Patient ID: [name], female    DOB: 1966-04-01, 45 y.o.   MRN: [id number]  HPI  here for follow up - reviewed chronic medical issues:  Recurrent iritis - initial episode B 2007, again B 2010 - now L eye symptom flare 5.2012 - ?rheum eval per optho  DM2 - previously followed with dr. Everardo All - currently diet controlled- prev on metformin reports sugars have been "ok" - running 110-120 - denies hypoglycemia events, no sugar >200  ovarian cancer - s/p surg and chemo 2009 follows with dr. 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 Charlett Blake) several times in past for same - on voltaren + vicodin - sometimes none, never more than 4/d expresses understanding that her weight is contributing to the pain  HTN - no recent med changes -no adv SE of meds  Depression/anxiety -long hx same (started age 29s) - on meds for same feels symptoms controlled with current tx - sertraline + strattera In therapy summer 2011 -not participating in counseling at this time intol of prozac, seroquel, and zoloft; trazadone, celexa and effexor-  Resumed low dose sertraline 01/2010 Also using strattera for adhd symptoms   morbid obesity - weight loss and ongoing diet efforts reviewed - s/p eval by nutritionist - planning to change to vegan  hypothyroid - reports compliance with ongoing medical treatment and no changes in medication dose or frequency.  denies adverse side effects related to current therapy.   Past Medical History  Diagnosis Date  . Hepatitis B infection   . HEPATITIS C   . TOBACCO ABUSE   . BACK PAIN   . OBESITY, MORBID   . ADHD   . Generalized anxiety disorder   . Chronic pain syndrome   . HYPOTHYROIDISM   . HYPERTENSION   . DM   . DEPRESSION   . ADENOCARCINOMA, OVARY, LEFT   . ALLERGIC RHINITIS     Review of Systems    Constitutional: Negative for fever.  Respiratory: Negative for cough and shortness of breath.   Cardiovascular: Negative for chest pain.  Genitourinary: Negative for dysuria.       Objective:   Physical Exam  BP 124/72  Pulse 110  Temp(Src) 98.1 F (36.7 C) (Oral)  Ht 5' 7.5" (1.715 m)  Wt 317 lb (143.79 kg)  BMI 48.92 kg/m2  SpO2 95%  Wt Readings from Last 3 Encounters:  12/01/10 317 lb (143.79 kg)  07/02/10 312 lb 12.8 oz (141.885 kg)  01/28/10 337 lb 6.4 oz (153.044 kg)   Constitutional: Obese. She appears well-developed and well-nourished. No distress.  Neck: Normal range of motion. Neck supple. No JVD present. No thyromegaly present.  Cardiovascular: Normal rate, regular rhythm and normal heart sounds.  No murmur heard. chronic trace BLE edema without change Pulmonary/Chest: Effort normal and breath sounds normal. No respiratory distress. She has no wheezes.  Psychiatric: She has a normal mood and affect. Her behavior is normal. Judgment and thought content normal.   Lab Results  Component Value Date   WBC 12.4* 05/31/2010   HGB 16.2* 08/29/2010   HCT 48.1* 08/29/2010   PLT 300 08/29/2010   CHOL 148 06/01/2007   TRIG 160* 06/01/2007   HDL 20* 06/01/2007   ALT 29 08/29/2010   AST 29 08/29/2010   NA 139 08/29/2010   K 4.3 08/29/2010  CL 100 08/29/2010   CREATININE 0.96 08/29/2010   BUN 10 08/29/2010   CO2 25 08/29/2010   TSH 3.04 07/02/2010   HGBA1C 6.2 07/02/2010       Assessment & Plan:  See problem list. Medications and labs reviewed today.

## 2010-12-01 NOTE — Assessment & Plan Note (Signed)
Diet controlled, previously on metformin -  check labs today - Encouraged resumed weight loss efforts Lab Results  Component Value Date   HGBA1C 6.2 07/02/2010

## 2010-12-11 ENCOUNTER — Other Ambulatory Visit: Payer: Self-pay | Admitting: Internal Medicine

## 2010-12-11 NOTE — Telephone Encounter (Signed)
Faxed script back to [facility] @ [phone]..12/11/10@1 :35pm/LMB

## 2010-12-30 LAB — URINALYSIS, ROUTINE W REFLEX MICROSCOPIC
Glucose, UA: NEGATIVE
Ketones, ur: NEGATIVE
Protein, ur: NEGATIVE
Urobilinogen, UA: 1

## 2010-12-30 LAB — COMPREHENSIVE METABOLIC PANEL
ALT: 15
Alkaline Phosphatase: 130 — ABNORMAL HIGH
CO2: 23
Calcium: 9.1
Chloride: 96
GFR calc non Af Amer: >60
Glucose, Bld: 92
Sodium: 131 — ABNORMAL LOW
Total Bilirubin: 1

## 2010-12-30 LAB — CBC
Hemoglobin: 12.7
MCHC: 33
MCV: 82
RBC: 4.7

## 2010-12-30 LAB — DIFFERENTIAL
Basophils Relative: 0
Eosinophils Absolute: 0.1
Eosinophils Relative: 1
Lymphs Abs: 3
Neutrophils Relative %: 73

## 2010-12-31 LAB — CBC
HCT: 33.9 — ABNORMAL LOW
Hemoglobin: 11.4 — ABNORMAL LOW
MCHC: 33.5
Platelets: 480 — ABNORMAL HIGH
Platelets: 502 — ABNORMAL HIGH
RDW: 16.4 — ABNORMAL HIGH
RDW: 16.5 — ABNORMAL HIGH

## 2010-12-31 LAB — WOUND CULTURE

## 2010-12-31 LAB — DIFFERENTIAL
Blasts: 0
Lymphocytes Relative: 15
Lymphs Abs: 2.1
Metamyelocytes Relative: 0
Monocytes Relative: 5
Monocytes Relative: 9
Neutro Abs: 9 — ABNORMAL HIGH
Neutrophils Relative %: 73
Neutrophils Relative %: 76
nRBC: 0

## 2010-12-31 LAB — COMPREHENSIVE METABOLIC PANEL
Albumin: 2.9 — ABNORMAL LOW
BUN: 11
Calcium: 9.3
Glucose, Bld: 116 — ABNORMAL HIGH
Sodium: 134 — ABNORMAL LOW
Total Protein: 7.3

## 2010-12-31 LAB — HEMOGLOBIN A1C: Mean Plasma Glucose: 119

## 2011-01-01 LAB — CBC
HCT: 32.7 — ABNORMAL LOW
HCT: 33.2 — ABNORMAL LOW
Hemoglobin: 10.8 — ABNORMAL LOW
MCHC: 32.9
MCHC: 33
MCV: 82.2
Platelets: 533 — ABNORMAL HIGH
Platelets: 590 — ABNORMAL HIGH
RBC: 3.26 — ABNORMAL LOW
RBC: 3.98
RDW: 16.6 — ABNORMAL HIGH
WBC: 12.6 — ABNORMAL HIGH

## 2011-01-01 LAB — COMPREHENSIVE METABOLIC PANEL
Albumin: 3 — ABNORMAL LOW
Alkaline Phosphatase: 144 — ABNORMAL HIGH
BUN: 7
CO2: 30
Calcium: 9.1
Creatinine, Ser: 0.77
Creatinine, Ser: 0.97
GFR calc non Af Amer: >60
Glucose, Bld: 107 — ABNORMAL HIGH
Potassium: 3.9
Total Bilirubin: 0.9
Total Protein: 7.5

## 2011-01-01 LAB — WOUND CULTURE: Gram Stain: NONE SEEN

## 2011-01-01 LAB — ANAEROBIC CULTURE

## 2011-01-01 LAB — TISSUE CULTURE: Gram Stain: NONE SEEN

## 2011-01-01 LAB — VANCOMYCIN, TROUGH: Vancomycin Tr: 23.6 — ABNORMAL HIGH

## 2011-01-02 LAB — CBC
HCT: 32.5 — ABNORMAL LOW
HCT: 36.5
Hemoglobin: 10.3 — ABNORMAL LOW
Hemoglobin: 10.8 — ABNORMAL LOW
Hemoglobin: 11.9 — ABNORMAL LOW
MCHC: 31.8
MCHC: 32
MCHC: 32.6
MCV: 78.8
MCV: 80.2
MCV: 80.5
Platelets: 398
RBC: 4.2
RBC: 4.64
RDW: 17.2 — ABNORMAL HIGH
RDW: 17.4 — ABNORMAL HIGH
WBC: 19.9 — ABNORMAL HIGH

## 2011-01-02 LAB — URINALYSIS, ROUTINE W REFLEX MICROSCOPIC
Hgb urine dipstick: NEGATIVE
Nitrite: NEGATIVE
Specific Gravity, Urine: 1.023
Urobilinogen, UA: 1

## 2011-01-02 LAB — GLUCOSE, CAPILLARY
Glucose-Capillary: 100 — ABNORMAL HIGH
Glucose-Capillary: 102 — ABNORMAL HIGH
Glucose-Capillary: 103 — ABNORMAL HIGH
Glucose-Capillary: 104 — ABNORMAL HIGH
Glucose-Capillary: 105 — ABNORMAL HIGH
Glucose-Capillary: 106 — ABNORMAL HIGH
Glucose-Capillary: 110 — ABNORMAL HIGH
Glucose-Capillary: 119 — ABNORMAL HIGH
Glucose-Capillary: 119 — ABNORMAL HIGH
Glucose-Capillary: 131 — ABNORMAL HIGH
Glucose-Capillary: 134 — ABNORMAL HIGH
Glucose-Capillary: 82
Glucose-Capillary: 86
Glucose-Capillary: 90
Glucose-Capillary: 92
Glucose-Capillary: 93

## 2011-01-02 LAB — VANCOMYCIN, TROUGH: Vancomycin Tr: 21.4 — ABNORMAL HIGH

## 2011-01-02 LAB — BASIC METABOLIC PANEL
CO2: 25
CO2: 26
Calcium: 9
Chloride: 102
Chloride: 104
Chloride: 106
Creatinine, Ser: 0.84
Creatinine, Ser: 0.96
Creatinine, Ser: 1.01
GFR calc Af Amer: >60
GFR calc Af Amer: >60
GFR calc Af Amer: >60
Glucose, Bld: 97
Potassium: 3.5
Potassium: 3.9
Sodium: 135
Sodium: 140
Sodium: 140

## 2011-01-02 LAB — URINE MICROSCOPIC-ADD ON

## 2011-01-02 LAB — HEMOGLOBIN A1C
Hgb A1c MFr Bld: 5.5
Mean Plasma Glucose: 119

## 2011-01-02 LAB — URINE CULTURE

## 2011-01-02 LAB — DIFFERENTIAL
Basophils Relative: 0
Eosinophils Absolute: 0.1
Eosinophils Relative: 0
Lymphs Abs: 0.9
Monocytes Absolute: 0.9
Monocytes Relative: 4

## 2011-01-02 LAB — CULTURE, BLOOD (ROUTINE X 2)

## 2011-01-09 ENCOUNTER — Other Ambulatory Visit: Payer: Self-pay | Admitting: Internal Medicine

## 2011-01-09 NOTE — Telephone Encounter (Signed)
Faxed script back to [facility] @ [phone]...01/09/11@1 :41pm/LMB

## 2011-01-14 ENCOUNTER — Telehealth: Payer: Self-pay

## 2011-01-14 NOTE — Telephone Encounter (Signed)
Pt called requesting a callback regarding orders for labs by another physician. I tried to call the patient back but number in EPIC is disconnected and the number pt called from was blocked.

## 2011-01-20 ENCOUNTER — Telehealth: Payer: Self-pay | Admitting: *Deleted

## 2011-01-20 NOTE — Telephone Encounter (Signed)
Pt left message stating that she is suppose to have labs done for another MD and wanted to know if she can have them done here. Pt informed to contact her physician to have lab orders faxed over. Pt verbalized understanding.

## 2011-01-27 ENCOUNTER — Other Ambulatory Visit: Payer: Self-pay | Admitting: Internal Medicine

## 2011-01-27 DIAGNOSIS — H44119 Panuveitis, unspecified eye: Secondary | ICD-10-CM

## 2011-01-27 NOTE — Progress Notes (Signed)
Received fax request from [facility] for labs and MRI of brain rule out MS due to ocular symptoms Labs/MRI entered - patient to be informed of same and will fax results to Dr. Ladoris Gene when complete. Fax number [phone], office [phone]

## 2011-01-27 NOTE — Progress Notes (Signed)
Notified pt md put order in for labs work, also once MRI has been set up will be contacted by [facility] with appt, date and time...01/27/11@4 :33pm/LMB

## 2011-01-28 ENCOUNTER — Other Ambulatory Visit: Payer: Self-pay | Admitting: Internal Medicine

## 2011-01-28 ENCOUNTER — Other Ambulatory Visit (INDEPENDENT_AMBULATORY_CARE_PROVIDER_SITE_OTHER): Payer: Medicare Other

## 2011-01-28 DIAGNOSIS — H44119 Panuveitis, unspecified eye: Secondary | ICD-10-CM

## 2011-01-28 LAB — HEPATIC FUNCTION PANEL
AST: 31 U/L (ref 0–37)
Alkaline Phosphatase: 139 U/L — ABNORMAL HIGH (ref 39–117)
Total Bilirubin: 0.8 mg/dL (ref 0.3–1.2)

## 2011-01-28 LAB — BASIC METABOLIC PANEL
GFR: 56.59 mL/min — ABNORMAL LOW (ref >60.00)
Potassium: 4.3 mEq/L (ref 3.5–5.1)
Sodium: 139 mEq/L (ref 135–145)

## 2011-01-28 LAB — CBC WITH DIFFERENTIAL/PLATELET
Basophils Absolute: 0 10*3/uL (ref 0.0–0.1)
Eosinophils Absolute: 0.1 10*3/uL (ref 0.0–0.7)
MCHC: 33.8 g/dL (ref 30.0–36.0)
MCV: 93.4 fl (ref 78.0–100.0)
Monocytes Absolute: 0.4 10*3/uL (ref 0.1–1.0)
Neutrophils Relative %: 65.2 % (ref 43.0–77.0)
Platelets: 293 10*3/uL (ref 150.0–400.0)

## 2011-01-29 LAB — HEPATITIS C ANTIBODY: HCV Ab: REACTIVE — AB

## 2011-01-29 LAB — HEPATITIS B CORE ANTIBODY, TOTAL: Hep B Core Total Ab: NEGATIVE

## 2011-01-29 LAB — RPR

## 2011-02-02 LAB — QUANTIFERON TB GOLD ASSAY (BLOOD): Interferon Gamma Release Assay: NEGATIVE

## 2011-02-05 ENCOUNTER — Other Ambulatory Visit: Payer: Self-pay | Admitting: Internal Medicine

## 2011-02-06 ENCOUNTER — Ambulatory Visit
Admission: RE | Admit: 2011-02-06 | Discharge: 2011-02-06 | Disposition: A | Discharge status: Home / Self Care | Payer: Medicare Other | Source: Ambulatory Visit | Attending: Internal Medicine | Admitting: Internal Medicine

## 2011-02-06 DIAGNOSIS — H44119 Panuveitis, unspecified eye: Secondary | ICD-10-CM

## 2011-02-09 ENCOUNTER — Other Ambulatory Visit: Payer: Self-pay | Admitting: Oncology

## 2011-02-09 ENCOUNTER — Telehealth: Payer: Self-pay | Admitting: Oncology

## 2011-02-09 ENCOUNTER — Ambulatory Visit (HOSPITAL_BASED_OUTPATIENT_CLINIC_OR_DEPARTMENT_OTHER): Payer: Medicare Other | Admitting: Oncology

## 2011-02-09 ENCOUNTER — Other Ambulatory Visit (HOSPITAL_BASED_OUTPATIENT_CLINIC_OR_DEPARTMENT_OTHER): Payer: Medicare Other | Admitting: Lab

## 2011-02-09 VITALS — BP 120/84 | HR 104 | Temp 97.6°F | Ht 68.0 in | Wt 325.8 lb

## 2011-02-09 DIAGNOSIS — C569 Malignant neoplasm of unspecified ovary: Secondary | ICD-10-CM

## 2011-02-09 DIAGNOSIS — E039 Hypothyroidism, unspecified: Secondary | ICD-10-CM

## 2011-02-09 DIAGNOSIS — E119 Type 2 diabetes mellitus without complications: Secondary | ICD-10-CM

## 2011-02-09 DIAGNOSIS — F17200 Nicotine dependence, unspecified, uncomplicated: Secondary | ICD-10-CM

## 2011-02-09 DIAGNOSIS — F411 Generalized anxiety disorder: Secondary | ICD-10-CM

## 2011-02-09 LAB — CBC WITH DIFFERENTIAL/PLATELET
BASO%: 0.6 % (ref 0.0–2.0)
EOS%: 1.5 % (ref 0.0–7.0)
HGB: 16.8 g/dL — ABNORMAL HIGH (ref 11.6–15.9)
MCH: 31.8 pg (ref 25.1–34.0)
MCHC: 33.9 g/dL (ref 31.5–36.0)
MCV: 93.7 fL (ref 79.5–101.0)
MONO%: 4.7 % (ref 0.0–14.0)
RBC: 5.3 10*6/uL (ref 3.70–5.45)
RDW: 15.8 % — ABNORMAL HIGH (ref 11.2–14.5)
lymph#: 2.8 10*3/uL (ref 0.9–3.3)

## 2011-02-09 LAB — COMPREHENSIVE METABOLIC PANEL
ALT: 33 U/L (ref 0–35)
AST: 30 U/L (ref 0–37)
Albumin: 4.1 g/dL (ref 3.5–5.2)
Alkaline Phosphatase: 141 U/L — ABNORMAL HIGH (ref 39–117)
Chloride: 102 mEq/L (ref 96–112)
Potassium: 4 mEq/L (ref 3.5–5.3)
Sodium: 138 mEq/L (ref 135–145)
Total Protein: 7 g/dL (ref 6.0–8.3)

## 2011-02-09 NOTE — Telephone Encounter (Signed)
gve the pt her may 2013 appt calendar 

## 2011-02-09 NOTE — Progress Notes (Signed)
OFFICE PROGRESS NOTE  HISTORY:  Patient is seen, alone for the visit today, in scheduled 6 month follow up of her history of ovarian carcinoma.  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  She had a very difficult and prolonged subsequent course with wound problems, but did have adjuvant taxol/ carboplatin x 6 cycles from September 2009 thru December 2009. Last CT abdomen/ pelvis was in July 2010 and last MRI abdomen was in January 2011.  She was seen last by Dr. Cleda Mccreedy in Jan. 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  She is scheduled to see Dr. Duard Brady later this week. [name] has no residual problems related to the chemotherapy.    Primary care is by Dr. Willey Blade, whom she sees every 4 - 6 months, next appointment in December. She sees Dr. George Hugh as needed for diabetes and hypothyroidism. She had mesh repair of large ventral hernia in February 2012 by Dr. Gordy Savers, prn follow up with him now. 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 She had MRI brain in [facility] system Nov.2, 2012, which showed only right maxillary sinus opacification and 1.6 cm area possible lymph node in left parotid region. She has remote history of right parotid obstruction, but has not had similar discomfort on the left recently.  Review of systems:  No new or different abdominal or pelvic pain. Stable area LLQ since ventral hernia repair, not painful.  No fever or recent infections. Slight soreness left eye again past 24 hours without light sensitivity or vision change. No headache or other neurologic symptoms. No SOB, cough, chest pain, palpitations, change on breast self-exam, bowels moving regularly, no bladder symptoms, no  discomfort or bleeding perineum. No swelling LE.  For past 7 weeks she has been swimming at the [facility] 4 days weekly, which has improved chronic low back pain; she is jogging 1 mile in pool each workout. She has been hungrier with the exercise and has gained some weight, but plans to address this now.  Patient declines influenza vaccine today.  Family History: sister with breast cancer  Objective:  Vital signs in last 24 hours:  Weight 325 lbs, which is up 10 lbs from May, height 5'8'', BP 120/84, temp 97.6, RR 20 HEENT: PERR, left sclera slightly injected, mouth clear and moist, no palpable mass left parotid area or clear adenopathy, neck supple without JVD, no cervical or supraclavicular adenopathy.  Lungs clear. Heart RRR without gallop. Abdomen obese with well-healed lower incision. Above the incision in LLQ there is a firm, rounded area ~ 4x4 cm, not tender or fixed, which seems stable. No palpable HSM or other masses. LE no edema, cords, tenderness.  Neuro nonfocal. No concerning skin lesions seen.    Lab Results: today WBC 9.9, HGB 16.8, plt 320 and CMET pending, ca125 pending.   Basename 02/09/11 1129  WBC --  HGB 16.8*  HCT 49.6*  PLT 320    BMET No results found for this basename: NA:2,K:2,CL:2,CO2:2,GLUCOSE:2,BUN:2,CREATININE:2,CALCIUM:2 in the last 72 hours  Studies/Results: Mammograms done at [facility] September 08, 2010 no findings of concern.  No results found.  Medications: I have reviewed the patient's current medications.  AssessmentPlan  1.  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 Follow up with gyn oncology later this week. Return visit here with lab 6 months. 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 3. Morbid obesity, now enjoying a regular water exercise program and to watch diet again. 4. Post repair of large ventral  abdominal hernia 5.  Diabetes and hypothyroidism, on treatment. 6.  1.6 cm possible lymph node in right parotid area, not symptomatic 7.  Family history of breast cancer in sister   @RRHLOS @   [name] Packer, MD   02/09/2011, 2:02 PM

## 2011-02-10 ENCOUNTER — Telehealth: Payer: Self-pay

## 2011-02-10 NOTE — Telephone Encounter (Signed)
LM FOR MS. Goon THAT HER CA-125 LEVEL FROM 02-09-11 WAS FINE AT 11.3 PER DR. Darrold Span.

## 2011-02-11 ENCOUNTER — Encounter: Payer: Self-pay | Admitting: Gynecologic Oncology

## 2011-02-11 ENCOUNTER — Ambulatory Visit: Payer: Medicare Other | Attending: Gynecologic Oncology | Admitting: Gynecologic Oncology

## 2011-02-11 ENCOUNTER — Other Ambulatory Visit: Payer: Self-pay | Admitting: Internal Medicine

## 2011-02-11 VITALS — BP 120/72 | HR 78 | Temp 98.4°F | Resp 20 | Ht 68.0 in | Wt 325.0 lb

## 2011-02-11 DIAGNOSIS — C549 Malignant neoplasm of corpus uteri, unspecified: Secondary | ICD-10-CM

## 2011-02-11 DIAGNOSIS — E119 Type 2 diabetes mellitus without complications: Secondary | ICD-10-CM | POA: Insufficient documentation

## 2011-02-11 DIAGNOSIS — I10 Essential (primary) hypertension: Secondary | ICD-10-CM | POA: Insufficient documentation

## 2011-02-11 DIAGNOSIS — D071 Carcinoma in situ of vulva: Secondary | ICD-10-CM | POA: Insufficient documentation

## 2011-02-11 DIAGNOSIS — C569 Malignant neoplasm of unspecified ovary: Secondary | ICD-10-CM | POA: Insufficient documentation

## 2011-02-11 DIAGNOSIS — Z79899 Other long term (current) drug therapy: Secondary | ICD-10-CM | POA: Insufficient documentation

## 2011-02-11 DIAGNOSIS — Z90710 Acquired absence of both cervix and uterus: Secondary | ICD-10-CM | POA: Insufficient documentation

## 2011-02-11 DIAGNOSIS — E039 Hypothyroidism, unspecified: Secondary | ICD-10-CM | POA: Insufficient documentation

## 2011-02-11 NOTE — Progress Notes (Signed)
Consult Note: Gyn-Onc  [name] 45 y.o. female  CC:  Chief Complaint  Patient presents with  . Follow-up    Ovarian cancer, VIN III  HPI: [name] is a very pleasant 45 year old woman with surgical staging in 2009 for a stage IA grade 1 endometrioid adenocarcinoma. At that time she presented with an 18 cm ovarian mass. Postoperatively, she received adjuvant chemotherapy consisting of 6 cycles of paclitaxel and carboplatin. She's been followed both by our service as well as by Dr. Darrold Span with no recurrent disease. Her CA 125 at the time of diagnosis is 94.5. Most recently yesterday was 11.3. She had surgery by Dr. gross for laparoscopic repair of her large ventral hernia and did quite well with that. Her surgery was in February of 2012. Most recently she has had iritis and has been followed by a physician in ophthalmology at [facility] since May of 2012. Should MRI last week which showed right maxillary sinus opacification and a 1.6 cm centimeter versus a possible lymph node in the left parotid region. She does have a history of a right parotid obstruction but has not had similar discomfort in that area currently the one I last saw her in January there was a lesion visible on the left side of her clitoris. 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 No action was taken in terms of treatment at that time.   Interval History: In the interim she's been doing well she's exercising 4 days a week running in the water at her local gym. She is somewhat disappointed that she's continued to gain weight however appear. She is now modified her diet much better her son has moved to [facility] and is working at the casinos and she is considering moving there with him  Past Medical Hx:  Past Medical History  Diagnosis Date  . Hepatitis B infection   . HEPATITIS C   . TOBACCO ABUSE   . BACK PAIN   . OBESITY, MORBID   . ADHD   . Generalized anxiety disorder   . Chronic pain  syndrome   . HYPOTHYROIDISM   . HYPERTENSION   . DM   . DEPRESSION   . ALLERGIC RHINITIS   . ADENOCARCINOMA, OVARY, LEFT 2009   Past Surgical Hx:  Past Surgical History  Procedure Date  . Tonsillectomy 1985  . Cholecystectomy 2005  . Abdominal hysterectomy 2009  . Bilateral oophorectomy 2009  . Ventral hernia repair 05/2010  . Dilation and curettage of uterus MARCH 85  . Dislocated ankle 82  . Cervical polypectomy 87  . Ectopic pregnancy surgery 98  . Dysplasia 3/11    REMOVAL   Current Meds:  Outpatient Encounter Prescriptions as of 02/11/2011  Medication Sig Dispense Refill  . Cyclobenzaprine HCl (FLEXERIL PO) Take 10 mg by mouth.       . diclofenac (VOLTAREN) 75 MG EC tablet Take 75 mg by mouth 2 (two) times daily.        . Glucose Blood (TRUETEST TEST VI) by In Vitro route 2 (two) times daily.        . hydrochlorothiazide 25 MG tablet TAKE 1 TABLET BY MOUTH EVERY DAY OR AS DIRECTED  40 tablet  5  . HYDROcodone-acetaminophen (NORCO) 7.5-325 MG per tablet TAKE 1 TABLET BY MOUTH EVERY 4-6 HOURS AS NEEDED FOR PAIN  60 tablet  0  . levothyroxine (SYNTHROID, LEVOTHROID) 125 MCG tablet TAKE 2 TABLETS BY MOUTH EVERY OTHER DAY ALTERNATING WITH 3 TABLETS  EVERY OTHER DAY  75 tablet  5  . NEXIUM 40 MG capsule TAKE 1 CAPSULE BY MOUTH ONCE DAILY  90 capsule  1  . prednisoLONE acetate (PRED FORTE) 1 % ophthalmic suspension Place 1 drop into the left eye 4 (four) times daily.       . sertraline (ZOLOFT) 25 MG tablet TAKE 1 TABLET BY MOUTH EVERY DAY  30 tablet  5  . STRATTERA 40 MG capsule TAKE 1 CAPSULE BY MOUTH EVERY DAY  30 capsule  5  . TRUEPLUS LANCETS 28G MISC by Does not apply route 2 (two) times daily.        . valACYclovir (VALTREX) 500 MG tablet Take 500 mg by mouth 2 (two) times daily.         Allergy:  Allergies  Allergen Reactions  . Sulfonamide Derivatives Hives   Social Hx:   History   Social History  . Marital Status: Widowed    Spouse Name: N/A    Number of  Children: N/A  . Years of Education: N/A   Occupational History  . Not on file.   Social History Main Topics  . Smoking status: Current Everyday Smoker -- 0.5 packs/day for 25 years    Types: Cigarettes  . Smokeless tobacco: Not on file  . Alcohol Use: No  . Drug Use: No  . Sexually Active:    Other Topics Concern  . Not on file   Social History Narrative  . No narrative on file   Family Hx:  Family History  Problem Relation Age of Onset  . Diabetes Mother   . Hypothyroidism Mother   . Lung cancer Mother   . Lung cancer Sister     Pertinent Gynecological History:  ROS: She denies any chest pain shortness of breath nausea vomiting fevers chills headaches visual changes. She denies any significant unintentional weight loss or weight gain. She denies a change in bladder or bowel habits.  10 oint review of system is negative. Vitals:  Blood pressure 120/72, pulse 78, temperature 98.4 F (36.9 C), temperature source Oral, resp. rate 20, height 5\' 8"  (1.727 m), weight 325 lb (147.419 kg).  Physical Exam: Neck is supple there is no lymphadenopathy. On the left side high in the right occipital lobe there is a palpable node is nontender.  Lungs are clear to auscultation bilaterally.  Cardiovascular regular rate rhythm.  Abdomen is morbidly obese with a surgical incisions. Abdomen is soft nontender nondistended. There is a 2 cm palpable nodule near the incision on the left aspect which has been described as scar tissue in the past no other masses or nodularities appreciated.  Extremities 2+ nonpitting edema equal bilaterally.  External genitalia is notable for skin tag near the left buttock measuring approximately 3 mm. There is a small hyperkeratotic raised lesion near the left clitoris measures approximately 4 mm. In the hairline just at the mons are 2 small 3 mm lesions consistent with VIN bimanual examination reveals no masses or nodularity. Rectal confirms.  Exam is limited  by habitus. Assessment/Plan: [name] is a very pleasant 45 year old with stage IA ovarian carcinoma diagnosed and treated in May of 2009 was no evidence of recurrent disease she has lobar dysplasia. It was opted not to proceed with treatment in January however as she might be moving to [facility] she would like to get this area taken care of as would have been in to have removed. I discussed with her that this would need to  be done in the operating room as we cannot do this in clinic. She is amenable to getting this scheduled convenience. She will be scheduled for surgery but otherwise return to see [place] in 6 months if you Dr. Darrold Span as scheduled. She'll also continue following up with her health care professionals including her ophthalmologist at [facility] A., MD 02/11/2011, 11:18 AM

## 2011-02-11 NOTE — Patient Instructions (Signed)
We'll schedule surgery accordingly for laser of the vulva as well as removal of skin tag. She'll otherwise return to see [place] in 6 months with followup of her other physicians as scheduled

## 2011-02-17 ENCOUNTER — Other Ambulatory Visit: Payer: Self-pay | Admitting: Internal Medicine

## 2011-02-17 NOTE — Telephone Encounter (Signed)
Faxed script back to [facility] @ [phone]...02/17/11@11 ;09am/LMB

## 2011-02-25 ENCOUNTER — Encounter (HOSPITAL_BASED_OUTPATIENT_CLINIC_OR_DEPARTMENT_OTHER): Payer: Self-pay | Admitting: *Deleted

## 2011-02-25 NOTE — Progress Notes (Addendum)
NPO AFTER MN WITH EXCEPTION CLEAR LIQUIDS UNTIL 0800 (NO CREAM/ MILK PRODUCTS). PT TO ARRIVE 1215. NEEDS CXR AND EKG. CBC W/ DIFF WAS ORDERED BUT THERE IS A CURRENT RESULT IN EPIC. CALLED AND LM FOR NANCY WILKERSON RN FOR DR Duard Brady, IF OK TO USE THIS, WILL WAIT FOR RESPONSE.  IF SO, PT WLL NEED ISTAT .  WILL TAKE NEXIUM, SYNTHROID, VALTRAX, AND IF NEEDED HYDROCODONE W/ SIPS OF WATER.

## 2011-03-03 ENCOUNTER — Ambulatory Visit (HOSPITAL_COMMUNITY): Payer: Medicare Other

## 2011-03-03 ENCOUNTER — Ambulatory Visit (HOSPITAL_BASED_OUTPATIENT_CLINIC_OR_DEPARTMENT_OTHER)
Admission: RE | Admit: 2011-03-03 | Discharge: 2011-03-03 | Disposition: A | Discharge status: Home / Self Care | Payer: Medicare Other | Source: Ambulatory Visit | Attending: Gynecologic Oncology | Admitting: Gynecologic Oncology

## 2011-03-03 ENCOUNTER — Encounter (HOSPITAL_BASED_OUTPATIENT_CLINIC_OR_DEPARTMENT_OTHER): Payer: Self-pay | Admitting: Gynecologic Oncology

## 2011-03-03 ENCOUNTER — Encounter (HOSPITAL_BASED_OUTPATIENT_CLINIC_OR_DEPARTMENT_OTHER): Payer: Self-pay | Admitting: Anesthesiology

## 2011-03-03 ENCOUNTER — Other Ambulatory Visit: Payer: Self-pay

## 2011-03-03 ENCOUNTER — Encounter (HOSPITAL_BASED_OUTPATIENT_CLINIC_OR_DEPARTMENT_OTHER)
Admission: RE | Disposition: A | Discharge status: Home / Self Care | Payer: Self-pay | Source: Ambulatory Visit | Attending: Gynecologic Oncology

## 2011-03-03 ENCOUNTER — Ambulatory Visit (HOSPITAL_BASED_OUTPATIENT_CLINIC_OR_DEPARTMENT_OTHER): Payer: Medicare Other | Admitting: Anesthesiology

## 2011-03-03 DIAGNOSIS — E119 Type 2 diabetes mellitus without complications: Secondary | ICD-10-CM | POA: Insufficient documentation

## 2011-03-03 DIAGNOSIS — Z8543 Personal history of malignant neoplasm of ovary: Secondary | ICD-10-CM | POA: Insufficient documentation

## 2011-03-03 DIAGNOSIS — D071 Carcinoma in situ of vulva: Secondary | ICD-10-CM | POA: Insufficient documentation

## 2011-03-03 DIAGNOSIS — I10 Essential (primary) hypertension: Secondary | ICD-10-CM | POA: Insufficient documentation

## 2011-03-03 DIAGNOSIS — Z90710 Acquired absence of both cervix and uterus: Secondary | ICD-10-CM | POA: Insufficient documentation

## 2011-03-03 DIAGNOSIS — E039 Hypothyroidism, unspecified: Secondary | ICD-10-CM | POA: Insufficient documentation

## 2011-03-03 DIAGNOSIS — A630 Anogenital (venereal) warts: Secondary | ICD-10-CM

## 2011-03-03 DIAGNOSIS — L909 Atrophic disorder of skin, unspecified: Secondary | ICD-10-CM | POA: Insufficient documentation

## 2011-03-03 DIAGNOSIS — Z79899 Other long term (current) drug therapy: Secondary | ICD-10-CM | POA: Insufficient documentation

## 2011-03-03 DIAGNOSIS — F17200 Nicotine dependence, unspecified, uncomplicated: Secondary | ICD-10-CM | POA: Insufficient documentation

## 2011-03-03 HISTORY — DX: Other psychoactive substance abuse, in remission: F19.11

## 2011-03-03 HISTORY — DX: Carcinoma in situ of vulva: D07.1

## 2011-03-03 HISTORY — DX: Gastro-esophageal reflux disease without esophagitis: K21.9

## 2011-03-03 HISTORY — DX: Chronic iridocyclitis, left eye: H20.12

## 2011-03-03 HISTORY — DX: Hypothyroidism, unspecified: E03.9

## 2011-03-03 HISTORY — DX: Other diseases of salivary glands: K11.8

## 2011-03-03 HISTORY — PX: VULVAR LESION REMOVAL: SHX5391

## 2011-03-03 LAB — POCT I-STAT 4, (NA,K, GLUC, HGB,HCT)
Glucose, Bld: 99 mg/dL (ref 70–99)
HCT: 54 % — ABNORMAL HIGH (ref 36.0–46.0)
Hemoglobin: 18.4 g/dL — ABNORMAL HIGH (ref 12.0–15.0)
Sodium: 144 mEq/L (ref 135–145)

## 2011-03-03 SURGERY — CO2 LASER APPLICATION
Anesthesia: General | Site: Perineum | Wound class: Clean Contaminated

## 2011-03-03 MED ORDER — ONDANSETRON HCL 4 MG/2ML IJ SOLN
INTRAMUSCULAR | Status: DC | PRN
  Administered 2011-03-03: 4 mg via INTRAVENOUS

## 2011-03-03 MED ORDER — BACITRACIN-NEOMYCIN-POLYMYXIN OINTMENT TUBE
TOPICAL_OINTMENT | CUTANEOUS | Status: DC | PRN
  Administered 2011-03-03: 1 via TOPICAL

## 2011-03-03 MED ORDER — FERRIC SUBSULFATE SOLN
Status: DC | PRN
  Administered 2011-03-03: 1

## 2011-03-03 MED ORDER — FENTANYL CITRATE 0.05 MG/ML IJ SOLN
25.0000 ug | INTRAMUSCULAR | Status: DC | PRN
  Administered 2011-03-03: 25 ug via INTRAVENOUS
  Administered 2011-03-03: 50 ug via INTRAVENOUS

## 2011-03-03 MED ORDER — SUCCINYLCHOLINE CHLORIDE 20 MG/ML IJ SOLN
INTRAMUSCULAR | Status: DC | PRN
  Administered 2011-03-03: 120 mg via INTRAVENOUS

## 2011-03-03 MED ORDER — LIDOCAINE HCL 2 % EX GEL
CUTANEOUS | Status: DC | PRN
  Administered 2011-03-03: 1 via TOPICAL

## 2011-03-03 MED ORDER — LIDOCAINE HCL (CARDIAC) 20 MG/ML IV SOLN
INTRAVENOUS | Status: DC | PRN
  Administered 2011-03-03: 100 mg via INTRAVENOUS

## 2011-03-03 MED ORDER — PROPOFOL 10 MG/ML IV EMUL
INTRAVENOUS | Status: DC | PRN
  Administered 2011-03-03: 25 mg via INTRAVENOUS

## 2011-03-03 MED ORDER — MIDAZOLAM HCL 5 MG/5ML IJ SOLN
INTRAMUSCULAR | Status: DC | PRN
  Administered 2011-03-03: 2 mg via INTRAVENOUS

## 2011-03-03 MED ORDER — PROMETHAZINE HCL 25 MG/ML IJ SOLN: 6.2500 mg | INTRAMUSCULAR | Status: DC | PRN

## 2011-03-03 MED ORDER — FENTANYL CITRATE 0.05 MG/ML IJ SOLN
INTRAMUSCULAR | Status: DC | PRN
  Administered 2011-03-03: 100 ug via INTRAVENOUS
  Administered 2011-03-03: 50 ug via INTRAVENOUS

## 2011-03-03 MED ORDER — LACTATED RINGERS IV SOLN: INTRAVENOUS | Status: DC

## 2011-03-03 MED ORDER — ACETIC ACID 5 % SOLN
Status: DC | PRN
  Administered 2011-03-03: 1 via TOPICAL

## 2011-03-03 MED ORDER — LACTATED RINGERS IV SOLN
INTRAVENOUS | Status: DC
  Administered 2011-03-03: 13:00:00 via INTRAVENOUS

## 2011-03-03 SURGICAL SUPPLY — 47 items
APPLICATOR COTTON TIP 6IN STRL (MISCELLANEOUS) ×4 IMPLANT
BAG DRN ANRFLXCHMBR STRAP LEK (BAG)
BAG URINE DRAINAGE (UROLOGICAL SUPPLIES) IMPLANT
BAG URINE LEG 19OZ MD ST LTX (BAG) IMPLANT
BLADE SURG 15 STRL LF DISP TIS (BLADE) ×2 IMPLANT
BLADE SURG 15 STRL SS (BLADE) ×4
CANISTER SUCTION 1200CC (MISCELLANEOUS) IMPLANT
CANISTER SUCTION 2500CC (MISCELLANEOUS) IMPLANT
CATH FOLEY 2WAY SLVR  5CC 16FR (CATHETERS)
CATH FOLEY 2WAY SLVR 5CC 16FR (CATHETERS) IMPLANT
CATH ROBINSON RED A/P 16FR (CATHETERS) IMPLANT
CLOTH BEACON ORANGE TIMEOUT ST (SAFETY) ×2 IMPLANT
COVER TABLE BACK 60X90 (DRAPES) ×2 IMPLANT
DEPRESSOR TONGUE BLADE STERILE (MISCELLANEOUS) ×2 IMPLANT
DRAPE LG THREE QUARTER DISP (DRAPES) ×2 IMPLANT
DRAPE UNDERBUTTOCKS STRL (DRAPE) ×2 IMPLANT
DRESSING TELFA 8X3 (GAUZE/BANDAGES/DRESSINGS) IMPLANT
ELECT BALL LEEP 3MM BLK (ELECTRODE) IMPLANT
ELECT REM PT RETURN 9FT ADLT (ELECTROSURGICAL) ×2
ELECTRODE REM PT RTRN 9FT ADLT (ELECTROSURGICAL) ×1 IMPLANT
GLOVE BIO SURGEON STRL SZ 6.5 (GLOVE) ×4 IMPLANT
GLOVE BIO SURGEON STRL SZ7.5 (GLOVE) ×2 IMPLANT
GLOVE INDICATOR 6.5 STRL GRN (GLOVE) ×2 IMPLANT
GLOVE INDICATOR 7.0 STRL GRN (GLOVE) ×2 IMPLANT
GOWN PREVENTION PLUS LG XLONG (DISPOSABLE) ×4 IMPLANT
LEGGING LITHOTOMY PAIR STRL (DRAPES) ×4 IMPLANT
NDL HYPO 25X1 1.5 SAFETY (NEEDLE) IMPLANT
NEEDLE HYPO 25X1 1.5 SAFETY (NEEDLE) IMPLANT
NS IRRIG 500ML POUR BTL (IV SOLUTION) IMPLANT
PACK BASIN DAY SURGERY FS (CUSTOM PROCEDURE TRAY) ×2 IMPLANT
PAD OB MATERNITY 4.3X12.25 (PERSONAL CARE ITEMS) ×2 IMPLANT
PAD PREP 24X48 CUFFED NSTRL (MISCELLANEOUS) ×2 IMPLANT
PENCIL BUTTON HOLSTER BLD 10FT (ELECTRODE) IMPLANT
SCOPETTES 8  STERILE (MISCELLANEOUS) ×2
SCOPETTES 8 STERILE (MISCELLANEOUS) ×2 IMPLANT
SUT VIC AB 2-0 SH 27 (SUTURE)
SUT VIC AB 2-0 SH 27XBRD (SUTURE) IMPLANT
SUT VIC AB 3-0 PS2 18 (SUTURE)
SUT VIC AB 3-0 PS2 18XBRD (SUTURE) IMPLANT
SYR CONTROL 10ML LL (SYRINGE) IMPLANT
TOWEL OR 17X24 6PK STRL BLUE (TOWEL DISPOSABLE) ×4 IMPLANT
TRAY DSU PREP LF (CUSTOM PROCEDURE TRAY) ×2 IMPLANT
TUBE CONNECTING 12X1/4 (SUCTIONS) ×2 IMPLANT
VACUUM HOSE 7/8X10 W/ WAND (MISCELLANEOUS) IMPLANT
VACUUM HOSE/TUBING 7/8INX6FT (MISCELLANEOUS) ×2 IMPLANT
WATER STERILE IRR 500ML POUR (IV SOLUTION) ×2 IMPLANT
YANKAUER SUCT BULB TIP NO VENT (SUCTIONS) ×2 IMPLANT

## 2011-03-03 NOTE — Interval H&P Note (Signed)
History and Physical Interval Note:   03/03/2011   2:36 PM   [name]  has presented today for surgery, with the diagnosis of VULVAR LESION  The various methods of treatment have been discussed with the patient and family. After consideration of risks, benefits and other options for treatment, the patient has consented to  Procedure(s): CO2 LASER APPLICATION VULVAR LESION as a surgical intervention .  The patients' history has been reviewed, patient examined, no change in status, stable for surgery.  I have reviewed the patients' chart and labs.  Questions were answered to the patient's satisfaction.     Cleda Mccreedy A.  MD

## 2011-03-03 NOTE — H&P (Signed)
CC:  Chief Complaint   Patient presents with   .  Follow-up     Ovarian cancer, VIN III   HPI:  [name] is a very pleasant 45 year old woman with surgical staging in 2009 for a stage IA grade 1 endometrioid adenocarcinoma. At that time she presented with an 18 cm ovarian mass. Postoperatively, she received adjuvant chemotherapy consisting of 6 cycles of paclitaxel and carboplatin. She's been followed both by our service as well as by Dr. Darrold Span with no recurrent disease. Her CA 125 at the time of diagnosis is 94.5. Most recently yesterday was 11.3. She had surgery by Dr. gross for laparoscopic repair of her large ventral hernia and did quite well with that. Her surgery was in February of 2012. Most recently she has had iritis and has been followed by a physician in ophthalmology at [facility] since May of 2012. Should MRI last week which showed right maxillary sinus opacification and a 1.6 cm centimeter versus a possible lymph node in the left parotid region. She does have a history of a right parotid obstruction but has not had similar discomfort in that area currently the one I last saw her in January there was a lesion visible on the left side of her clitoris. 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 No action was taken in terms of treatment at that time.  Interval History: In the interim she's been doing well she's exercising 4 days a week running in the water at her local gym. She is somewhat disappointed that she's continued to gain weight however appear. She is now modified her diet much better her son has moved to [facility] and is working at the casinos and she is considering moving there with him  Past Medical Hx:  Past Medical History   Diagnosis  Date   .  Hepatitis B infection    .  HEPATITIS C    .  TOBACCO ABUSE    .  BACK PAIN    .  OBESITY, MORBID    .  ADHD    .  Generalized anxiety disorder    .  Chronic pain syndrome    .  HYPOTHYROIDISM    .   HYPERTENSION    .  DM    .  DEPRESSION    .  ALLERGIC RHINITIS    .  ADENOCARCINOMA, OVARY, LEFT  2009    Past Surgical Hx:  Past Surgical History   Procedure  Date   .  Tonsillectomy  1985   .  Cholecystectomy  2005   .  Abdominal hysterectomy  2009   .  Bilateral oophorectomy  2009   .  Ventral hernia repair  05/2010   .  Dilation and curettage of uterus  MARCH 85   .  Dislocated ankle  82   .  Cervical polypectomy  87   .  Ectopic pregnancy surgery  98   .  Dysplasia  3/11     REMOVAL    Current Meds:  Outpatient Encounter Prescriptions as of 02/11/2011   Medication  Sig  Dispense  Refill   .  Cyclobenzaprine HCl (FLEXERIL PO)  Take 10 mg by mouth.     .  diclofenac (VOLTAREN) 75 MG EC tablet  Take 75 mg by mouth 2 (two) times daily.     .  Glucose Blood (TRUETEST TEST VI)  by In Vitro route 2 (two) times daily.     Marland Kitchen  hydrochlorothiazide 25 MG tablet  TAKE 1 TABLET BY MOUTH EVERY DAY OR AS DIRECTED  40 tablet  5   .  HYDROcodone-acetaminophen (NORCO) 7.5-325 MG per tablet  TAKE 1 TABLET BY MOUTH EVERY 4-6 HOURS AS NEEDED FOR PAIN  60 tablet  0   .  levothyroxine (SYNTHROID, LEVOTHROID) 125 MCG tablet  TAKE 2 TABLETS BY MOUTH EVERY OTHER DAY ALTERNATING WITH 3 TABLETS EVERY OTHER DAY  75 tablet  5   .  NEXIUM 40 MG capsule  TAKE 1 CAPSULE BY MOUTH ONCE DAILY  90 capsule  1   .  prednisoLONE acetate (PRED FORTE) 1 % ophthalmic suspension  Place 1 drop into the left eye 4 (four) times daily.     .  sertraline (ZOLOFT) 25 MG tablet  TAKE 1 TABLET BY MOUTH EVERY DAY  30 tablet  5   .  STRATTERA 40 MG capsule  TAKE 1 CAPSULE BY MOUTH EVERY DAY  30 capsule  5   .  TRUEPLUS LANCETS 28G MISC  by Does not apply route 2 (two) times daily.     .  valACYclovir (VALTREX) 500 MG tablet  Take 500 mg by mouth 2 (two) times daily.      Allergy:  Allergies   Allergen  Reactions   .  Sulfonamide Derivatives  Hives    Social Hx:  History    Social History   .  Marital Status:  Widowed      Spouse Name:  N/A     Number of Children:  N/A   .  Years of Education:  N/A    Occupational History   .  Not on file.    Social History Main Topics   .  Smoking status:  Current Everyday Smoker -- 0.5 packs/day for 25 years     Types:  Cigarettes   .  Smokeless tobacco:  Not on file   .  Alcohol Use:  No   .  Drug Use:  No   .  Sexually Active:     Other Topics  Concern   .  Not on file    Social History Narrative   .  No narrative on file    Family Hx:  Family History   Problem  Relation  Age of Onset   .  Diabetes  Mother    .  Hypothyroidism  Mother    .  Lung cancer  Mother    .  Lung cancer  Sister     Pertinent Gynecological History:  ROS:  She denies any chest pain shortness of breath nausea vomiting fevers chills headaches visual changes. She denies any significant unintentional weight loss or weight gain. She denies a change in bladder or bowel habits. 10 oint review of system is negative.  Vitals: Blood pressure 120/72, pulse 78, temperature 98.4 F (36.9 C), temperature source Oral, resp. rate 20, height 5\' 8"  (1.727 m), weight 325 lb (147.419 kg).  Physical Exam:  Neck is supple there is no lymphadenopathy. On the left side high in the right occipital lobe there is a palpable node is nontender.  Lungs are clear to auscultation bilaterally.  Cardiovascular regular rate rhythm.  Abdomen is morbidly obese with a surgical incisions. Abdomen is soft nontender nondistended. There is a 2 cm palpable nodule near the incision on the left aspect which has been described as scar tissue in the past no other masses or nodularities appreciated.  Extremities 2+ nonpitting edema equal bilaterally.  External genitalia is notable for skin tag near the left buttock measuring approximately 3 mm. There is a small hyperkeratotic raised lesion near the left clitoris measures approximately 4 mm. In the hairline just at the mons are 2 small 3 mm lesions consistent with VIN bimanual  examination reveals no masses or nodularity. Rectal confirms. Exam is limited by habitus.  Assessment/Plan:  [name] is a very pleasant 45 year old with stage IA ovarian carcinoma diagnosed and treated in May of 2009 was no evidence of recurrent disease she has lobar dysplasia. It was opted not to proceed with treatment in January however as she might be moving to [facility] she would like to get this area taken care of as would have been in to have removed. I discussed with her that this would need to be done in the operating room as we cannot do this in clinic. She is amenable to getting this scheduled convenience. She will be scheduled for surgery but otherwise return to see [place] in 6 months if you Dr. Darrold Span as scheduled. She'll also continue following up with her health care professionals including her ophthalmologist at [facility]

## 2011-03-03 NOTE — Transfer of Care (Signed)
Immediate Anesthesia Transfer of Care Note  Patient: [name]  Procedure(s) Performed:  CO2 LASER APPLICATION - CO2 LASER VAPORIZATION VULVAR LESION AND REMOVAL SKIN TAG CO2 LASER ; VULVAR LESION  Patient Location: PACU  Anesthesia Type: General  Level of Consciousness: awake, oriented, sedated and patient cooperative  Airway & Oxygen Therapy: Patient Spontanous Breathing and Patient connected to face mask oxygen  Post-op Assessment: Report given to PACU RN and Post -op Vital signs reviewed and stable  Post vital signs: Reviewed and stable  Complications: No apparent anesthesia complications

## 2011-03-03 NOTE — Anesthesia Procedure Notes (Signed)
Procedure Name: Intubation Date/Time: 03/03/2011 2:49 PM Performed by: Renella Cunas D Pre-anesthesia Checklist: Patient identified, Emergency Drugs available, Suction available and Patient being monitored Patient Re-evaluated:Patient Re-evaluated prior to inductionOxygen Delivery Method: Circle System Utilized Preoxygenation: Pre-oxygenation with 100% oxygen Intubation Type: IV induction Ventilation: Mask ventilation without difficulty Laryngoscope Size: Mac and 4 Grade View: Grade I Tube type: Oral Tube size: 7.0 mm Number of attempts: 1 Airway Equipment and Method: stylet and oral airway Placement Confirmation: ETT inserted through vocal cords under direct vision,  positive ETCO2 and breath sounds checked- equal and bilateral Secured at: 21 cm Tube secured with: Tape Dental Injury: Teeth and Oropharynx as per pre-operative assessment

## 2011-03-03 NOTE — Op Note (Signed)
PATIENT: [name] DATE OF BIRTH: [birth year] ENCOUNTER DATE:    Preop Diagnosis: VIN  Postoperative Diagnosis: same.   Surgery: Laser vulva  Surgeons:  Rejeana Brock A. 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 There is a small patch measuring 1 x 1 cm of condylomatous changes on the left labia minora. There is a 4 mm skin tag in the left buttocks.  Procedure: The patient was identified in the preoperative holding area. Informed consent was signed on the chart. Patient was seen history was reviewed and exam was performed.   The patient was then taken to the operating room and placed in the supine position with SCD hose on. General anesthesia was then induced without difficulty.   The perineum was prepped with Hibiclens. First timeout was performed to confirm the patient procedure antibiotic allergy status. Acetic acid was then placed on the vulva and allowed to sit for 2 minutes.  The CO2 laser was then set at a power setting of a 2 blocks. It was used to ablate the areas in the appropriate fashion to the appropriate depth. There was minimal bleeding. The skin tag was then removed using a laser.  Careful inspection of the vulva revealed no additional lesions. The procedure was then concluded.   All instrument needle and Ray-Tec counts were correct x2. The patient tolerated the procedure well and was taken to the recovery room in stable condition. This is Cleda Mccreedy dictating an operative note on patient [name].

## 2011-03-03 NOTE — Anesthesia Preprocedure Evaluation (Addendum)
Anesthesia Evaluation  Patient identified by MRN, date of birth, ID band Patient awake    Reviewed: Allergy & Precautions, H&P , NPO status , Patient's Chart, lab work & pertinent test results  Airway Mallampati: II TM Distance: >3 FB Neck ROM: full    Dental  (+) Edentulous Upper and Edentulous Lower   Pulmonary neg pulmonary ROS,  clear to auscultation  Pulmonary exam normal       Cardiovascular Exercise Tolerance: Good hypertension, On Medications neg cardio ROS regular Normal    Neuro/Psych PSYCHIATRIC DISORDERS Depression Negative Neurological ROS  Negative Psych ROS   GI/Hepatic negative GI ROS, Neg liver ROS, GERD-  Medicated and Controlled,(+)     substance abuse  IV drug use, Hepatitis -, CDrug abuse 15 years ago   Endo/Other  Diabetes mellitus-, Type obesityHypothyroid No treatment or probs with diabetes for a while.  Past problem mostly  Renal/GU negative Renal ROS  Genitourinary negative   Musculoskeletal   Abdominal Normal abdominal exam  (+) obese,   Peds  Hematology negative hematology ROS (+)   Anesthesia Other Findings   Reproductive/Obstetrics negative OB ROS                       Anesthesia Physical Anesthesia Plan  ASA: III  Anesthesia Plan: General   Post-op Pain Management:    Induction: Intravenous  Airway Management Planned: LMA  Additional Equipment:   Intra-op Plan:   Post-operative Plan:   Informed Consent: I have reviewed the patients History and Physical, chart, labs and discussed the procedure including the risks, benefits and alternatives for the proposed anesthesia with the patient or authorized representative who has indicated his/her understanding and acceptance.     Plan Discussed with: CRNA and Surgeon  Anesthesia Plan Comments:       Anesthesia Quick Evaluation

## 2011-03-04 NOTE — Anesthesia Postprocedure Evaluation (Signed)
  Anesthesia Post-op Note  Patient: [name]  Procedure(s) Performed:  CO2 LASER APPLICATION - CO2 LASER VAPORIZATION VULVAR LESION AND REMOVAL SKIN TAG CO2 LASER ; VULVAR LESION  Patient Location: PACU  Anesthesia Type: General  Level of Consciousness: awake and alert   Airway and Oxygen Therapy: Patient Spontanous Breathing  Post-op Pain: mild  Post-op Assessment: Post-op Vital signs reviewed, Patient's Cardiovascular Status Stable, Respiratory Function Stable, Patent Airway and No signs of Nausea or vomiting  Post-op Vital Signs: stable  Complications: No apparent anesthesia complications

## 2011-03-05 ENCOUNTER — Encounter (HOSPITAL_BASED_OUTPATIENT_CLINIC_OR_DEPARTMENT_OTHER): Payer: Self-pay | Admitting: Gynecologic Oncology

## 2011-03-07 ENCOUNTER — Other Ambulatory Visit: Payer: Self-pay | Admitting: Internal Medicine

## 2011-03-21 ENCOUNTER — Other Ambulatory Visit: Payer: Self-pay | Admitting: Internal Medicine

## 2011-03-23 ENCOUNTER — Ambulatory Visit: Payer: Medicare Other | Admitting: Internal Medicine

## 2011-03-23 NOTE — Telephone Encounter (Signed)
Faxed script back to [facility] @ [phone]...03/23/11@12 :12pm/LMB

## 2011-04-08 ENCOUNTER — Ambulatory Visit: Payer: Medicare Other | Admitting: Internal Medicine

## 2011-04-14 SURGERY — Surgical Case
Anesthesia: *Unknown

## 2011-04-30 ENCOUNTER — Other Ambulatory Visit: Payer: Self-pay | Admitting: Internal Medicine

## 2011-05-07 ENCOUNTER — Ambulatory Visit: Payer: Medicare Other | Attending: Gynecologic Oncology | Admitting: Gynecologic Oncology

## 2011-05-07 ENCOUNTER — Encounter: Payer: Self-pay | Admitting: Gynecologic Oncology

## 2011-05-07 VITALS — BP 116/84 | HR 88 | Temp 97.8°F | Resp 18 | Ht 67.5 in | Wt 330.0 lb

## 2011-05-07 DIAGNOSIS — Z9079 Acquired absence of other genital organ(s): Secondary | ICD-10-CM | POA: Insufficient documentation

## 2011-05-07 DIAGNOSIS — Z79899 Other long term (current) drug therapy: Secondary | ICD-10-CM | POA: Insufficient documentation

## 2011-05-07 DIAGNOSIS — E119 Type 2 diabetes mellitus without complications: Secondary | ICD-10-CM | POA: Insufficient documentation

## 2011-05-07 DIAGNOSIS — C569 Malignant neoplasm of unspecified ovary: Secondary | ICD-10-CM | POA: Insufficient documentation

## 2011-05-07 DIAGNOSIS — Z90710 Acquired absence of both cervix and uterus: Secondary | ICD-10-CM | POA: Insufficient documentation

## 2011-05-07 DIAGNOSIS — F17200 Nicotine dependence, unspecified, uncomplicated: Secondary | ICD-10-CM | POA: Insufficient documentation

## 2011-05-07 DIAGNOSIS — I10 Essential (primary) hypertension: Secondary | ICD-10-CM | POA: Insufficient documentation

## 2011-05-07 DIAGNOSIS — Z9221 Personal history of antineoplastic chemotherapy: Secondary | ICD-10-CM | POA: Insufficient documentation

## 2011-05-07 DIAGNOSIS — K219 Gastro-esophageal reflux disease without esophagitis: Secondary | ICD-10-CM | POA: Insufficient documentation

## 2011-05-07 DIAGNOSIS — D071 Carcinoma in situ of vulva: Secondary | ICD-10-CM | POA: Insufficient documentation

## 2011-05-07 DIAGNOSIS — E039 Hypothyroidism, unspecified: Secondary | ICD-10-CM | POA: Insufficient documentation

## 2011-05-07 NOTE — Progress Notes (Signed)
Consult Note: Gyn-Onc  [name] 46 y.o. female  CC:  Chief Complaint  Patient presents with  . VIN III    s/p laser    HPI:   Progress Notes   [name] (MR# [id number])       Progress Notes Info       Author Note Status Last Update User Last Update Date/Time    [name] Leonhart A. Duard Brady, MD Signed [name] Buffy. Duard Brady, MD 02/11/2011 11:28 AM      Progress Notes     Consult Note: Gyn-Onc   [name] 46 y.o. female   CC:  Chief Complaint   Patient presents with   .  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 At that time she presented with an 18 cm ovarian mass. Postoperatively, she received adjuvant chemotherapy consisting of 6 cycles of paclitaxel and carboplatin. She's been followed both by our service as well as by [name] with no recurrent disease. Her CA 125 at the time of diagnosis is 94.5. Most recently 11/12 it was 11.3. She had surgery by [name] for laparoscopic repair of her large ventral hernia and did quite well with that. Her surgery was in February of 2012. Most recently she has had iritis and has been followed by a physician in ophthalmology at [facility] since May of 2012. Should MRI last week which showed right maxillary sinus opacification and a 1.6 cm centimeter versus a possible lymph node in the left parotid region. She does have a history of a right parotid obstruction but has not had similar discomfort in that area currently the one I last saw her in January there was a lesion visible on the left side of her clitoris. 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 No action was taken in terms of treatment at that time.  She underwent laser 03/03/11 for VIN.  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 A small patch measuring  1 x 1 cm condylomatous changes in the left labia minora and a 4 mm skin tag in the left buttocks. These were completely removed. She comes in today for a postoperative check.   Interval History: In the interim she's been doing well she's exercising 4 days a week running in the water at her local gym. She is somewhat disappointed that she's continued to gain weight however appear. She is now modified her diet much better her son has moved to [place] and is working at the casinos and she is considering moving there with him  ROS: She denies any chest pain shortness of breath nausea vomiting fevers chills headaches visual changes. She denies any significant unintentional weight loss or weight gain. She denies a change in bladder or bowel habits.  10 point review of system is negative.  She did very well post-operatively.   Current Meds:  Outpatient Encounter Prescriptions as of 05/07/2011  Medication Sig Dispense Refill  . Cyclobenzaprine HCl (FLEXERIL PO) Take 10 mg by mouth every evening.       . diclofenac (VOLTAREN) 75 MG EC tablet Take 75 mg by mouth 2 (two) times daily. PT TO STOP ON 02-26-11      . esomeprazole (NEXIUM) 40 MG capsule        .  Glucose Blood (TRUETEST TEST VI) by In Vitro route 2 (two) times daily.        . hydrochlorothiazide 25 MG tablet TAKE 1 TABLET BY MOUTH EVERY DAY OR AS DIRECTED  40 tablet  5  . HYDROcodone-acetaminophen (NORCO) 7.5-325 MG per tablet TAKE 1 TABLET BY MOUTH EVERY 4-6 HOURS AS NEEDED FOR PAIN  60 tablet  0  . levothyroxine (SYNTHROID, LEVOTHROID) 125 MCG tablet TAKE 2 TABLETS BY MOUTH EVERY OTHER DAY ALTERNATING WITH 3 TABLETS EVERY OTHER DAY  75 tablet  5  . prednisoLONE acetate (PRED FORTE) 1 % ophthalmic suspension Place 1 drop into the left eye 4 (four) times daily.       . sertraline (ZOLOFT) 25 MG tablet TAKE 1 TABLET BY MOUTH EVERY DAY  30 tablet  5  . STRATTERA 40 MG capsule TAKE 1 CAPSULE BY MOUTH EVERY DAY  30 capsule  5  .  TRUEPLUS LANCETS 28G MISC by Does not apply route 2 (two) times daily.       . valACYclovir (VALTREX) 500 MG tablet Take 500 mg by mouth 2 (two) times daily.         Allergy:  Allergies  Allergen Reactions  . Sulfonamide Derivatives Hives    Social Hx:   History   Social History  . Marital Status: Widowed    Spouse Name: N/A    Number of Children: N/A  . Years of Education: N/A   Occupational History  . Not on file.   Social History Main Topics  . Smoking status: Current Everyday Smoker -- 0.3 packs/day for 25 years    Types: Cigarettes  . Smokeless tobacco: Never Used   Comment: cutting down  . Alcohol Use: No     QUIT 1997  . Drug Use: No     QUIT 1997- COCAINE, MARIJUANA, METHAPHENMINES  . Sexually Active: No   Other Topics Concern  . Not on file   Social History Narrative  . No narrative on file    Past Surgical Hx:  Past Surgical History  Procedure Date  . Cholecystectomy 2005  . Abdominal hysterectomy 2009  . Bilateral oophorectomy 2009  . Ventral hernia repair 05-30-10  . Dilation and curettage of uterus MARCH 85  . Cervical polypectomy 1987  . Ectopic pregnancy surgery 1998  . Wide local excision labrial lesion 06-13-09  . Multiple i & d's for panniculitis X4-- LAST ONE 10-25-2007  . Tonsillectomy and adenoidectomy 1985  . Vulvar lesion removal 03/03/2011    Procedure: VULVAR LESION;  Surgeon: [name] Brock A. Duard Brady, MD;  Location: [facility];  Service: Gynecology;  Laterality: N/A;  . Eye injection     Past Medical Hx:  Past Medical History  Diagnosis Date  . OBESITY, MORBID   . ADHD   . Generalized anxiety disorder   . HYPERTENSION   . DEPRESSION   . Hypothyroidism   . 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 Obstruction of parotid duct REMOTE HX OF -- PER MRI    ASYMPTOMATIC  . GERD (gastroesophageal reflux disease)     CONTROLLED W/ NEXIUM  . Joint ache OCCASIONAL IN KNEES AND ANKLES  .  Chronic iritis, left eye DX 2007    CURRENT TX SINCE MAY 2012 FOLLOW-- OP BY OPHTHALMOLGY AT [facility]  . Hepatitis B infection DX 1991-- POSITIVE ANTI-BODY    NO TX--  ASYMPTOMATIC LAST 11YRS--- FOLLOWED BY DR Felicity Coyer (PCP)  . History of substance  abuse QUIT 1997  . Diabetes mellitus     DIET CONTROLLED AND EXERCISE  . ADENOCARCINOMA, OVARY, LEFT 2009- ONCOLOGIST -- DR Darrold [name]    S/P HYSTERECTOMY WITH BSO AND CHEMO--- NO RECURRANCE    Family Hx:  Family History  Problem Relation Age of Onset  . Diabetes Mother   . Hypothyroidism Mother   . Lung cancer Mother   . Lung cancer Sister     Vitals:  Blood pressure 116/84, pulse 88, temperature 97.8 F (36.6 C), temperature source Oral, resp. rate 18, height 5' 7.5" (1.715 m), weight 330 lb (149.687 kg).  Physical Exam:  Well-nourished well-developed female in no acute distress.  Pelvic: Normal external female genitalia with well-healed laser sites. There are no new lesions.  Assessment/Plan: Assessment/Plan: Ms. Knoles is a very pleasant 46 year old with stage IA ovarian carcinoma diagnosed and treated in May of 2009 was no evidence of recurrent disease she has vulvar dysplasia. She will see [name] in May of 2013 for followup of her ovarian cancer with a CA 125. She'll return to see [place] in August or September of 2013.   Marland KitchenCleda Mccreedy A., MD 05/07/2011, 12:41 PM

## 2011-05-07 NOTE — Patient Instructions (Signed)
RTC in 8-9/13

## 2011-05-11 ENCOUNTER — Other Ambulatory Visit (INDEPENDENT_AMBULATORY_CARE_PROVIDER_SITE_OTHER): Payer: Medicare Other

## 2011-05-11 ENCOUNTER — Ambulatory Visit (INDEPENDENT_AMBULATORY_CARE_PROVIDER_SITE_OTHER): Payer: Medicare Other | Admitting: Internal Medicine

## 2011-05-11 ENCOUNTER — Other Ambulatory Visit: Payer: Self-pay | Admitting: Internal Medicine

## 2011-05-11 ENCOUNTER — Encounter: Payer: Self-pay | Admitting: Internal Medicine

## 2011-05-11 DIAGNOSIS — E119 Type 2 diabetes mellitus without complications: Secondary | ICD-10-CM

## 2011-05-11 DIAGNOSIS — F909 Attention-deficit hyperactivity disorder, unspecified type: Secondary | ICD-10-CM

## 2011-05-11 DIAGNOSIS — E039 Hypothyroidism, unspecified: Secondary | ICD-10-CM

## 2011-05-11 MED ORDER — LEVOTHYROXINE SODIUM 125 MCG PO TABS: 375.0000 ug | ORAL_TABLET | Freq: Every day | ORAL | Status: DC

## 2011-05-11 MED ORDER — AMPHETAMINE-DEXTROAMPHETAMINE 10 MG PO TABS: 10.0000 mg | ORAL_TABLET | Freq: Two times a day (BID) | ORAL | Status: DC

## 2011-05-11 NOTE — Progress Notes (Signed)
Subjective:    Patient ID: [name], female    DOB: Mar 20, 1966, 46 y.o.   MRN: [id number]  HPI  here for follow up - reviewed chronic medical issues:  Recurrent iritis, B - initial episode B 2007, again B 2010 - now L eye symptom flare 5.2012 -   DM2 - previously followed with dr. Everardo All - currently diet controlled- prev on metformin reports sugars have been "ok" - running 110-120 - denies hypoglycemia events, no sugar >200  ovarian cancer - s/p surg and chemo 2009 follows with dr. 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 Charlett Blake) several times in past for same - on voltaren + vicodin - sometimes none, never more than 4/d expresses understanding that her weight is contributing to the pain  HTN - the patient reports compliance with medication(s) as prescribed. Denies adverse side effects.  Depression/anxiety -long hx same (started age 56s) - on meds for same feels symptoms controlled with current tx - sertraline + strattera In therapy summer 2011 -not participating in counseling at this time intol of prozac, seroquel, and zoloft; trazadone, celexa and effexor-  Resumed low dose sertraline 01/2010 Also using strattera for adhd symptoms > but not covered by insurance, willing to try alt med as not on other meds   morbid obesity - weight loss and ongoing diet efforts reviewed - s/p eval by nutritionist - working on water aerobics  hypothyroid - reports compliance with ongoing medical treatment and no changes in medication dose or frequency. denies adverse side effects related to current therapy.   Past Medical History  Diagnosis Date  . OBESITY, MORBID   . ADHD   . Generalized anxiety disorder   . HYPERTENSION   . DEPRESSION   . Hypothyroidism   . 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  Obstruction of parotid duct REMOTE HX OF -- PER MRI    ASYMPTOMATIC  . GERD (gastroesophageal reflux disease)     CONTROLLED W/ NEXIUM  . Joint ache OCCASIONAL IN KNEES AND ANKLES  . Chronic iritis, left eye DX 2007    CURRENT TX SINCE MAY 2012 FOLLOW-- OP BY O[facility]THALMOLGY AT [facility]  . Hepatitis B infection DX 1991-- POSITIVE ANTI-BODY    NO TX--  ASYMPTOMATIC LAST 73YRS--- FOLLOWED BY DR Felicity Coyer (PCP)  . History of substance abuse QUIT 1997  . Diabetes mellitus     DIET CONTROLLED AND EXERCISE  . 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 Negative for fever and unexpected weight change.  Respiratory: Negative for cough and shortness of breath.   Cardiovascular: Negative for chest pain.  Genitourinary: Negative for dysuria.       Objective:   Physical Exam  BP 112/82  Pulse 111  Temp(Src) 97.1 F (36.2 C) (Oral)  Wt 330 lb 3.2 oz (149.778 kg)  SpO2 92%  Wt Readings from Last 3 Encounters:  05/11/11 330 lb 3.2 oz (149.778 kg)  05/07/11 330 lb (149.687 kg)  02/25/11 315 lb (142.883 kg)   Constitutional: Obese. She appears well-developed and well-nourished. No distress.  Neck: Normal range of motion. Neck supple. No JVD present. No thyromegaly present.  Cardiovascular: Normal rate, regular rhythm and normal heart  sounds.  No murmur heard. chronic trace BLE edema without change Pulmonary/Chest: Effort normal and breath sounds normal. No respiratory distress. She has no wheezes.  Psychiatric: She has a normal mood and affect. Her behavior is normal. Judgment and thought content normal.   Lab Results  Component Value Date   WBC 9.9 02/09/2011   HGB 18.4* 03/03/2011   HCT 54.0* 03/03/2011   PLT 320 02/09/2011   CHOL 148 06/01/2007   TRIG 160* 06/01/2007   HDL 20* 06/01/2007   ALT 33 02/09/2011   AST 30 02/09/2011   NA 144 03/03/2011   K 4.4  03/03/2011   CL 102 02/09/2011   CREATININE 1.03 02/09/2011   BUN 14 02/09/2011   CO2 28 02/09/2011   TSH 2.89 12/01/2010   HGBA1C 6.5 12/01/2010       Assessment & Plan:  See problem list. Medications and labs reviewed today.

## 2011-05-11 NOTE — Assessment & Plan Note (Signed)
On strattera for same - never on other meds Note co-existing anxiety but will change to adderall - pt to call if symptoms worse to consider other therapy

## 2011-05-11 NOTE — Patient Instructions (Signed)
It was good to see you today. Test(s) ordered today. Your results will be called to you after review (48-72hours after test completion). If any changes need to be made, you will be notified at that time. Medications reviewed, stop strattera and try Adderall for ADD symptoms - no other changes at this time. Please schedule followup in 4 months for diabetes mellitus and weight check, call sooner if problems.

## 2011-05-11 NOTE — Assessment & Plan Note (Signed)
The current medical regimen is effective;  continue present plan and medications. Check lab today Lab Results  Component Value Date   TSH 2.89 12/01/2010

## 2011-05-11 NOTE — Assessment & Plan Note (Signed)
Diet controlled, previously on metformin -  check labs today - Encouraged resumed weight loss efforts Lab Results  Component Value Date   HGBA1C 6.5 12/01/2010

## 2011-05-28 ENCOUNTER — Other Ambulatory Visit: Payer: Self-pay

## 2011-05-28 DIAGNOSIS — F909 Attention-deficit hyperactivity disorder, unspecified type: Secondary | ICD-10-CM

## 2011-05-28 MED ORDER — AMPHETAMINE-DEXTROAMPHETAMINE 10 MG PO TABS: 10.0000 mg | ORAL_TABLET | Freq: Three times a day (TID) | ORAL | Status: DC

## 2011-05-28 NOTE — Telephone Encounter (Signed)
ok 

## 2011-05-28 NOTE — Telephone Encounter (Signed)
Pt called stating that Adderall bid was not providing "enough" relief of ADHD sxs. Pt says she has increased to tid and feels that this has helped. Pt is requesting to continue with these directions, please advise

## 2011-05-29 NOTE — Telephone Encounter (Signed)
Pt's phone is out of service, Rx in cabinet for pt pick up

## 2011-06-17 ENCOUNTER — Other Ambulatory Visit: Payer: Self-pay | Admitting: Internal Medicine

## 2011-06-17 DIAGNOSIS — F909 Attention-deficit hyperactivity disorder, unspecified type: Secondary | ICD-10-CM

## 2011-06-17 MED ORDER — AMPHETAMINE-DEXTROAMPHETAMINE 10 MG PO TABS: 10.0000 mg | ORAL_TABLET | Freq: Three times a day (TID) | ORAL | Status: DC

## 2011-06-17 NOTE — Telephone Encounter (Signed)
Pt called to inform MD that she would prefer to continue with Adderall due to the cost of Strattera.

## 2011-06-17 NOTE — Telephone Encounter (Signed)
Notified pt rx's ready for pick-up... 06/17/11@2 :37pm/LMB

## 2011-07-19 ENCOUNTER — Other Ambulatory Visit: Payer: Self-pay | Admitting: Internal Medicine

## 2011-07-21 ENCOUNTER — Other Ambulatory Visit: Payer: Self-pay | Admitting: Internal Medicine

## 2011-07-22 ENCOUNTER — Other Ambulatory Visit: Payer: Self-pay | Admitting: *Deleted

## 2011-07-22 DIAGNOSIS — F909 Attention-deficit hyperactivity disorder, unspecified type: Secondary | ICD-10-CM

## 2011-07-22 NOTE — Telephone Encounter (Signed)
ok 

## 2011-07-22 NOTE — Telephone Encounter (Signed)
Pt is requesting refill of Adderall-last written 05/28/2011 #90 with 0 refills-please advise.

## 2011-07-23 MED ORDER — AMPHETAMINE-DEXTROAMPHETAMINE 10 MG PO TABS: 10.0000 mg | ORAL_TABLET | Freq: Three times a day (TID) | ORAL | Status: DC

## 2011-08-10 ENCOUNTER — Telehealth: Payer: Self-pay | Admitting: Oncology

## 2011-08-10 ENCOUNTER — Other Ambulatory Visit (HOSPITAL_BASED_OUTPATIENT_CLINIC_OR_DEPARTMENT_OTHER): Payer: Medicare Other | Admitting: Lab

## 2011-08-10 ENCOUNTER — Ambulatory Visit (HOSPITAL_BASED_OUTPATIENT_CLINIC_OR_DEPARTMENT_OTHER): Payer: Medicare Other | Admitting: Oncology

## 2011-08-10 ENCOUNTER — Encounter: Payer: Self-pay | Admitting: Oncology

## 2011-08-10 VITALS — BP 135/90 | HR 100 | Temp 98.8°F | Ht 67.5 in | Wt 316.2 lb

## 2011-08-10 DIAGNOSIS — R197 Diarrhea, unspecified: Secondary | ICD-10-CM

## 2011-08-10 DIAGNOSIS — C569 Malignant neoplasm of unspecified ovary: Secondary | ICD-10-CM

## 2011-08-10 LAB — COMPREHENSIVE METABOLIC PANEL
Albumin: 3.9 g/dL (ref 3.5–5.2)
Alkaline Phosphatase: 151 U/L — ABNORMAL HIGH (ref 39–117)
BUN: 12 mg/dL (ref 6–23)
CO2: 27 mEq/L (ref 19–32)
Calcium: 9.3 mg/dL (ref 8.4–10.5)
Chloride: 103 mEq/L (ref 96–112)
Glucose, Bld: 115 mg/dL — ABNORMAL HIGH (ref 70–99)
Potassium: 3.7 mEq/L (ref 3.5–5.3)
Sodium: 140 mEq/L (ref 135–145)
Total Protein: 6.9 g/dL (ref 6.0–8.3)

## 2011-08-10 LAB — CBC WITH DIFFERENTIAL/PLATELET
Eosinophils Absolute: 0.2 10*3/uL (ref 0.0–0.5)
MCV: 92.9 fL (ref 79.5–101.0)
MONO%: 5.6 % (ref 0.0–14.0)
NEUT#: 6.9 10*3/uL — ABNORMAL HIGH (ref 1.5–6.5)
RBC: 5.27 10*6/uL (ref 3.70–5.45)
RDW: 14.8 % — ABNORMAL HIGH (ref 11.2–14.5)
WBC: 10.4 10*3/uL — ABNORMAL HIGH (ref 3.9–10.3)
lymph#: 2.7 10*3/uL (ref 0.9–3.3)
nRBC: 0 % (ref 0–0)

## 2011-08-10 NOTE — Patient Instructions (Signed)
Try probiotic supplement daily for at least 4 weeks  -- [facility] or Karin Golden brand etc.  Call to set up mammograms for July, after you are back from vacation

## 2011-08-10 NOTE — Telephone Encounter (Signed)
Gv pt appt for [id number].  gv copy of order to kim to scheduled appt with Dr. Duard Brady for appt in [place] or [phone]

## 2011-08-10 NOTE — Progress Notes (Signed)
OFFICE PROGRESS NOTE Date of Visit 08-10-2011 Physicians: V.Leschber, S.Ellison, P.Gehrig, S.Gross  INTERVAL HISTORY:  Patient is seen, alone for visit, in scheduled 6 month follow up of her history of ovarian cancer (last visit on go-live day of [facility]), presently continuing observation. History is of T1aN0 well differentiated endometrioid carcinoma of ovary with 18 cm primary at [facility]/BSO/omentectomy (and 1 node evaluation) by Dr Kyla Balzarine May 2009. CA 125 at diagnosis was 94.5. She had a very difficult post operative course with wound healing problems related to her morbid obesity, with several debridement procedures required by Dr.Gross. She did receive 6 cycles of taxol/carboplatin thru Nov 2009, and has had no known active ovarian cancer since completion of that treatment. She has had laser treatment of VIN by Dr Duard Brady Nov 2012 and removal of condyloma. She saw Dr.Gehrig last 05-07-11 and should have return appointment there in ~ Aug or Sept 2013.  Patient had CXR Nov 2012 preop, with left basilar atelectasis vs scarring. She is to have mammograms at [facility] 09-08[facility]11. Dr.Leschber is following her regularly however there may be some difficulty with adjusting thyroid medication and patient may need to have reevaluation by Dr Everardo All.She is followed by [facility] ophthalmology for iritis. She is doing water exercises in two classes at [facility] center 4 days weekly, which she is enjoying, with significant improvement in strength and flexibility so far.   Patient still has some areas of discomfort related to the extensive abdominal scarring, with tenderness fairly localized in soft tissue RLQ with "burning" after exercise there x months. This is not worsening and not associated with other symptoms. She continues with multiple watery stools daily, especially after eating, as she has had since the ovarian cancer treatment. She has never tried probiotics, which I have asked her to use daily for next month.   On Review of Systems otherwise, no fever or specific symptoms of infection. Some allergic drainage. No increased shortness of breath or other respiratory symptoms. No bladder symptoms. No swelling LE. No bleeding. Arthritis symptoms stable, limit exercise out of water. Remainder of 10 point Review of Systems negative.  [name] is working at [employer]; she and sister are planning a vacation this summer. Objective:  Vital signs in last 24 hours:  BP 135/90  Pulse 100  Temp(Src) 98.8 F (37.1 C) (Oral)  Ht 5' 7.5" (1.715 m)  Wt 316 lb 3.2 oz (143.427 kg)  BMI 48.79 kg/m2 Weight is stable from a year ago. Noticeably more easily mobile.  HEENT:mucous membranes moist, pharynx normal without lesions PERRL. LymphaticsCervical, supraclavicular, and axillary nodes normal. Resp: clear to auscultation bilaterally and normal percussion bilaterally Cardio: regular rate and rhythm GI: obese, multiple large scars on abdomen with skin folds, no clear hernia, no rash, no erythema or heat. Nothing palpable. No clear inguinal adenopathy Extremities:no pitting edema, cords or tenderness Neuro:no sensory deficits noted  Lab Results:   Basename 08/10/11 1048  WBC 10.4*  HGB 16.2*  HCT 48.9*  PLT 293  ANC 6.9 Differential not remarkable  BMET CMET available after visit nonfasting glucose 115, creat 1.19, AP 151, AST 40 and otherwise WNL. CA 125 last done 11-12    11.3. As this was not repeated with her labs this visit, I will add to Dr Denman George next appointment. Studies/Results:  No results found. Mammograms scheduled June as above Medications: I have reviewed the patient's current medications. She is on HCTZ  Assessment/Plan: 1.T1aN0 ovarian carcinoma: history as above, clinically doing well. Follow up with  gyn oncology ~ Aug with ca125 and I will see her in a year or sooner if needed. 2.VIN post laser Nov 2012 3.morbid obesity: now exercising regularly 4.history of tobacco, which may  be ongoing per EMR  5.degenerative arthritis 6.diarrhea which has been ongoing for last 3 years: trial probiotics        [name] P, MD   08/10/2011, 12:09 PM

## 2011-08-18 ENCOUNTER — Telehealth: Payer: Self-pay | Admitting: Oncology

## 2011-08-18 NOTE — Telephone Encounter (Signed)
Talked to pt gave her appt for 8/29/th lab before MD visit

## 2011-09-22 ENCOUNTER — Ambulatory Visit: Payer: Medicare Other | Admitting: Internal Medicine

## 2011-09-22 ENCOUNTER — Other Ambulatory Visit: Payer: Self-pay | Admitting: Internal Medicine

## 2011-09-22 NOTE — Telephone Encounter (Signed)
Faxed script back to [facility]... 09/22/11@1 :56pm/LMB

## 2011-10-22 ENCOUNTER — Encounter: Payer: Self-pay | Admitting: Internal Medicine

## 2011-10-22 ENCOUNTER — Ambulatory Visit (INDEPENDENT_AMBULATORY_CARE_PROVIDER_SITE_OTHER): Payer: Medicare Other | Admitting: Internal Medicine

## 2011-10-22 ENCOUNTER — Other Ambulatory Visit (INDEPENDENT_AMBULATORY_CARE_PROVIDER_SITE_OTHER): Payer: Medicare Other

## 2011-10-22 VITALS — BP 118/80 | HR 94 | Temp 98.5°F | Ht 67.5 in | Wt 282.4 lb

## 2011-10-22 DIAGNOSIS — E039 Hypothyroidism, unspecified: Secondary | ICD-10-CM

## 2011-10-22 DIAGNOSIS — E119 Type 2 diabetes mellitus without complications: Secondary | ICD-10-CM

## 2011-10-22 DIAGNOSIS — F411 Generalized anxiety disorder: Secondary | ICD-10-CM

## 2011-10-22 DIAGNOSIS — I10 Essential (primary) hypertension: Secondary | ICD-10-CM

## 2011-10-22 LAB — TSH: TSH: 0.02 u[IU]/mL — ABNORMAL LOW (ref 0.35–5.50)

## 2011-10-22 LAB — HEMOGLOBIN A1C: Hgb A1c MFr Bld: 6.1 % (ref 4.6–6.5)

## 2011-10-22 MED ORDER — AMPHETAMINE-DEXTROAMPHETAMINE 10 MG PO TABS: 10.0000 mg | ORAL_TABLET | Freq: Three times a day (TID) | ORAL | Status: DC

## 2011-10-22 MED ORDER — LEVOTHYROXINE SODIUM 175 MCG PO TABS: 350.0000 ug | ORAL_TABLET | Freq: Every day | ORAL | Status: AC

## 2011-10-22 NOTE — Patient Instructions (Signed)
It was good to see you today. Test(s) ordered today. Your results will be called to you after review (48-72hours after test completion). If any changes need to be made, you will be notified at that time. Medications reviewed, wean off sertraline and stop HCTZ - no other changes at this time. Please schedule followup in 4-6 months for diabetes mellitus and weight check, call sooner if problems.

## 2011-10-22 NOTE — Addendum Note (Signed)
Addended by: Rene Paci A on: 10/22/2011 01:21 PM   Modules accepted: Orders

## 2011-10-22 NOTE — Assessment & Plan Note (Signed)
Controlling symptoms with exercise, low dose sertraline since 01/2010 and strattera -  Will wean off SSRI now - pt to call if relapse of symptoms

## 2011-10-22 NOTE — Assessment & Plan Note (Signed)
Diet controlled, previously on metformin -  check labs today - Encouraged resumed weight loss efforts Lab Results  Component Value Date   HGBA1C 6.3 05/11/2011

## 2011-10-22 NOTE — Progress Notes (Signed)
Subjective:    Patient ID: [name], female    DOB: 02/11/1966, 46 y.o.   MRN: [id number]  HPI  here for follow up - reviewed chronic medical issues:  Recurrent iritis, B - initial episode B 2007, again B 2010 - now L eye symptom flare 5.2012 -   DM2 - previously followed with dr. Everardo All - currently diet controlled- prev on metformin reports sugars have been "ok" - running 110-120 - denies hypoglycemia events, no sugar >200  ovarian cancer - s/p surg and chemo 2009 follows with dr. 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 Charlett Blake) several times in past for same - on voltaren + vicodin - sometimes none, never more than 4/d expresses understanding that her weight is contributing to the pain  HTN - the patient reports compliance with medication(s) as prescribed. Denies adverse side effects.  Depression/anxiety -long hx same (started age 75s) - on meds for same feels symptoms controlled with current tx - sertraline + strattera, ?wean off sertraline In therapy summer 2011 -not participating in counseling at this time intol of prozac, seroquel, and zoloft; trazadone, celexa and effexor-  Resumed low dose sertraline 01/2010 -  Also using adderral (prev straterra - change due to $) for adhd symptoms   morbid obesity - weight loss and ongoing diet efforts reviewed - s/p eval by nutritionist - working on water aerobics  hypothyroid - reports compliance with ongoing medical treatment and no changes in medication dose or frequency. denies adverse side effects related to current therapy.   Past Medical History  Diagnosis Date  . OBESITY, MORBID   . ADHD   . Generalized anxiety disorder   . HYPERTENSION   . DEPRESSION   . Hypothyroidism   . 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 Obstruction of parotid  duct REMOTE HX OF -- PER MRI    ASYMPTOMATIC  . GERD (gastroesophageal reflux disease)     CONTROLLED W/ NEXIUM  . Joint ache OCCASIONAL IN KNEES AND ANKLES  . Chronic iritis, left eye DX 2007    CURRENT TX SINCE MAY 2012 FOLLOW-- [facility]  . Hepatitis B infection DX 1991-- POSITIVE ANTI-BODY    NO TX--  ASYMPTOMATIC LAST 85YRS--- FOLLOWED BY DR Felicity Coyer (PCP)  . History of substance abuse QUIT 1997  . Diabetes mellitus     DIET CONTROLLED AND EXERCISE  . 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 Negative for fever and unexpected weight change.  Respiratory: Negative for cough and shortness of breath.   Cardiovascular: Negative for chest pain.  Genitourinary: Negative for dysuria.       Objective:   Physical Exam  BP 118/80  Pulse 94  Temp 98.5 F (36.9 C) (Oral)  Ht 5' 7.5" (1.715 m)  Wt 282 lb 6.4 oz (128.096 kg)  BMI 43.58 kg/m2  SpO2 96%  Wt Readings from Last 3 Encounters:  10/22/11 282 lb 6.4 oz (128.096 kg)  08/10/11 316 lb 3.2 oz (143.427 kg)  05/11/11 330 lb 3.2 oz (149.778 kg)   Constitutional: Obese. She appears well-developed and well-nourished. No distress.  Neck: Normal range of motion. Neck supple. No JVD present. No thyromegaly  present.  Cardiovascular: Normal rate, regular rhythm and normal heart sounds.  No murmur heard. chronic trace BLE edema without change Pulmonary/Chest: Effort normal and breath sounds normal. No respiratory distress. She has no wheezes.  Psychiatric: She has a normal mood and affect. Her behavior is normal. Judgment and thought content normal.   Lab Results  Component Value Date   WBC 10.4* 08/10/2011   HGB 16.2* 08/10/2011   HCT 48.9* 08/10/2011   PLT 293 08/10/2011   CHOL 148 06/01/2007   TRIG 160* 06/01/2007   HDL 20* 06/01/2007   ALT 35 08/10/2011   AST 40* 08/10/2011   NA 140  08/10/2011   K 3.7 08/10/2011   CL 103 08/10/2011   CREATININE 1.19* 08/10/2011   BUN 12 08/10/2011   CO2 27 08/10/2011   TSH 18.94* 05/11/2011   HGBA1C 6.3 05/11/2011       Assessment & Plan:  See problem list. Medications and labs reviewed today.

## 2011-10-22 NOTE — Assessment & Plan Note (Signed)
BP Readings from Last 3 Encounters:  10/22/11 118/80  08/10/11 135/90  05/11/11 112/82   Controlling with exercise/weight loss Stop HCTZ now

## 2011-10-22 NOTE — Assessment & Plan Note (Signed)
Recent dose adjustments reviewed Check lab today Lab Results  Component Value Date   TSH 18.94* 05/11/2011

## 2011-10-26 ENCOUNTER — Other Ambulatory Visit: Payer: Self-pay

## 2011-10-26 NOTE — Telephone Encounter (Signed)
Pt called stating that she was offered an Rx for diarrhea at recent OV but declined at that time? Pt is now requesting Rx, please advise.

## 2011-10-26 NOTE — Telephone Encounter (Signed)
Lomotil prn if Imodium ineffective

## 2011-10-27 MED ORDER — DIPHENOXYLATE-ATROPINE 2.5-0.025 MG PO TABS: 1.0000 | ORAL_TABLET | Freq: Four times a day (QID) | ORAL | Status: DC | PRN

## 2011-10-27 NOTE — Telephone Encounter (Signed)
Pt advised of Rx and pharmacy. Faxed to pharmacy on file.

## 2011-11-10 ENCOUNTER — Ambulatory Visit (INDEPENDENT_AMBULATORY_CARE_PROVIDER_SITE_OTHER): Payer: Medicare Other | Admitting: Internal Medicine

## 2011-11-10 ENCOUNTER — Encounter: Payer: Self-pay | Admitting: Internal Medicine

## 2011-11-10 VITALS — BP 120/72 | HR 96 | Temp 97.3°F | Ht 67.5 in | Wt 273.0 lb

## 2011-11-10 DIAGNOSIS — L259 Unspecified contact dermatitis, unspecified cause: Secondary | ICD-10-CM

## 2011-11-10 DIAGNOSIS — L309 Dermatitis, unspecified: Secondary | ICD-10-CM

## 2011-11-10 DIAGNOSIS — L578 Other skin changes due to chronic exposure to nonionizing radiation: Secondary | ICD-10-CM

## 2011-11-10 MED ORDER — PREDNISONE (PAK) 10 MG PO TABS: 10.0000 mg | ORAL_TABLET | ORAL | Status: AC

## 2011-11-10 MED ORDER — TRIAMCINOLONE ACETONIDE 0.1 % EX CREA: TOPICAL_CREAM | Freq: Two times a day (BID) | CUTANEOUS | Status: AC

## 2011-11-10 NOTE — Progress Notes (Signed)
  Subjective:    Patient ID: [name], female    DOB: 03-12-1966, 46 y.o.   MRN: [id number]  HPI  complains of rash Located on sun exposed areas - armas and legs + anterior chest Using OTC steroid without significant relief - also antihistamine using sun block lotion  Past Medical History  Diagnosis Date  . OBESITY, MORBID   . ADHD   . Generalized anxiety disorder   . HYPERTENSION   . DEPRESSION   . Hypothyroidism   . 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 Obstruction of parotid duct REMOTE HX OF -- PER MRI    ASYMPTOMATIC  . GERD (gastroesophageal reflux disease)     CONTROLLED W/ NEXIUM  . Chronic iritis, left eye DX 2007    CURRENT TX SINCE MAY 2012 FOLLOW-- OP [facility]  . Hepatitis C antibody test positive DX 1991-- POSITIVE ANTI-BODY    no treatment, declined INF  . History of substance abuse QUIT 1997    IVDA  . Diabetes mellitus     DIET CONTROLLED AND EXERCISE  . 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 No distress.  Neck: Normal range of motion. Neck supple. No JVD present. No thyromegaly present.  Cardiovascular: Normal rate, regular rhythm and normal heart sounds.  No murmur heard. No BLE edema. Pulmonary/Chest: Effort normal and breath sounds normal. No respiratory distress. She has no wheezes.  Skin: excema-like rash with excoriation on BUE>LE - sun damaged areas - remaining skin (trunk) is warm and dry. No rash noted. No erythema.    Psychiatric: She has a normal mood and affect. Her behavior is normal. Judgment and thought content normal.   Lab Results  Component Value Date   WBC 10.4* 08/10/2011   HGB 16.2* 08/10/2011   HCT 48.9* 08/10/2011   PLT 293 08/10/2011   GLUCOSE 115* 08/10/2011   CHOL 148 06/01/2007   TRIG 160* 06/01/2007   HDL 20* 06/01/2007   LDLCALC 96 06/01/2007   ALT 35 08/10/2011   AST 40* 08/10/2011   NA 140 08/10/2011   K 3.7 08/10/2011   CL 103 08/10/2011   CREATININE 1.19* 08/10/2011   BUN 12 08/10/2011   CO2 27 08/10/2011   TSH 0.02* 10/22/2011   HGBA1C 6.1 10/22/2011        Assessment & Plan:  Wynelle Link poisoning with eczema  pred pak x 6 days and topical rx advised change sun block and avoidance of solar radiation until healed (in pool swimming for weight loss)

## 2011-11-10 NOTE — Patient Instructions (Signed)
It was good to see you today. Pred taper x 6 days and prescription cream to affected skin - Your prescription(s) have been submitted to your pharmacy. Please take as directed and contact our office if you believe you are having problem(s) with the medication(s). Sun-Exposed Skin The skin is prone to change with aging. Its appendages are as well. This refers mostly to the hair and nails. The most common of all of the aging changes is graying hair. That is an example of a skin appendage change. CAUSES   Many factors may affect the speed of the aging process in the skin.  Exposure to sunlight speeds aging changes in the skin more than any other factor.  SYMPTOMS   Signs of aging of the skin may include:  Thinning and loss of pigmentation.   Dryness.   Wrinkling.   Decreased flexibility.   Likelihood of being fragile.   Bruising easily.   Dilated, visible blood vessels (telangiectases).   Hair loss.   Decreased sweating.  Some of these changes seem to be accelerated with sun exposure. Signs of sun exposure may include:  Liver spots (intense, dark pigmentation).   Thickened areas (keratoses).   Sunlight exposure (actinic exposure) may lead to cancer.  TREATMENT Preventive measures should begin in childhood. Methods to protect the skin from sun damage include:  Limited sun exposure.   Protection with clothing, hats, sunscreens.   Behavioral modification.   Most of [place] grew up at a time when a "healthy" tan was desired. As a result, damage from sun exposure has already occurred for most elderly patients. This is hardly reversible. But further damage can be prevented by using protection against the sun.   It is never too late to begin a program of sun protection to limit further damage.   Some prescription topical creams or lotions may improve changes in the skin. Ask your caregiver for possible options.  Document Released: 04/25/2010 Document Revised: 03/12/2011 Document  Reviewed: 04/25/2010 [facility] Patient Information 2012 [place], [place].

## 2011-11-13 ENCOUNTER — Other Ambulatory Visit: Payer: Medicare Other | Admitting: Lab

## 2011-11-20 ENCOUNTER — Other Ambulatory Visit: Payer: Self-pay

## 2011-11-20 MED ORDER — DIPHENOXYLATE-ATROPINE 2.5-0.025 MG PO TABS: 1.0000 | ORAL_TABLET | Freq: Four times a day (QID) | ORAL | Status: DC | PRN

## 2011-11-20 MED ORDER — AMPHETAMINE-DEXTROAMPHETAMINE 10 MG PO TABS: 10.0000 mg | ORAL_TABLET | Freq: Three times a day (TID) | ORAL | Status: DC

## 2011-11-20 NOTE — Telephone Encounter (Signed)
Pt informed, Rx in cabinet for pt pick up  

## 2011-11-25 ENCOUNTER — Telehealth: Payer: Self-pay

## 2011-11-25 DIAGNOSIS — R197 Diarrhea, unspecified: Secondary | ICD-10-CM | POA: Insufficient documentation

## 2011-11-25 NOTE — Telephone Encounter (Signed)
i made referral to gi

## 2011-11-25 NOTE — Telephone Encounter (Signed)
Pt informed of referral

## 2011-11-25 NOTE — Telephone Encounter (Signed)
Pt called requesting referral for a colonoscopy for chronic diarrhea since having hernia surgery 05/2010. I am unable to locate documentation of chronic diarrhea, please advise in VAL's absence.

## 2011-12-03 ENCOUNTER — Other Ambulatory Visit: Payer: Medicare Other | Admitting: Lab

## 2011-12-03 ENCOUNTER — Encounter: Payer: Self-pay | Admitting: Gynecologic Oncology

## 2011-12-03 ENCOUNTER — Ambulatory Visit: Payer: Medicare Other | Attending: Gynecologic Oncology | Admitting: Gynecologic Oncology

## 2011-12-03 VITALS — BP 102/70 | HR 82 | Temp 98.4°F | Resp 18 | Ht 67.0 in | Wt 268.8 lb

## 2011-12-03 DIAGNOSIS — N900 Mild vulvar dysplasia: Secondary | ICD-10-CM | POA: Insufficient documentation

## 2011-12-03 DIAGNOSIS — C569 Malignant neoplasm of unspecified ovary: Secondary | ICD-10-CM | POA: Insufficient documentation

## 2011-12-03 NOTE — Patient Instructions (Signed)
Follow up in [place] with gyn oncology in 4 months.

## 2011-12-03 NOTE — Progress Notes (Signed)
Consult Note: Gyn-Onc  [name] 46 y.o. female  CC:  Chief Complaint  Patient presents with  . 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 At that time she presented with an 18 cm ovarian mass. Postoperatively, she received adjuvant chemotherapy consisting of 6 cycles of paclitaxel and carboplatin. She's been followed both by our service as well as by Dr. Darrold Span with no recurrent disease. Her CA 125 at the time of diagnosis is 94.5. Most recently it was normal.  She had one drawn today that is pending . She had surgery by Dr. Michaell Cowing for laparoscopic repair of her large ventral hernia and did quite well with that. Her surgery was in February of 2012. In addition to her ovarian cancer, she underwent laser 03/03/11 for VIN. 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 A small patch measuring 1 x 1 cm condylomatous changes in the left labia minora and a 4 mm skin tag in the left buttocks. These were completely removed. She comes in today for her interval exam.  I last saw her 1/13 and she was seen by Dr. Darrold Span in 5/13.  Interval History: In the interim she's been doing well she's exercising 6 days a week for 2 hours a day running in the water at her local gym. She has lost about 60#. She is now modified her diet and is a vegan.  She feels much better and is off of her metformin, her anti-depressants and her anti-hypertensives. Her son has moved to [facility] and is working at the casinos and she is moving there with him. She would most likely follow up on [place] with gyn oncology.   ROS:  She denies any chest pain shortness of breath nausea vomiting fevers chills headaches visual changes. She denies any significant unintentional weight loss or weight gain. She denies a change in bladder or  bowel habits. She still has diarrhea every day as she has since her hernia repair. She takes lomotil in the morning so that she can swim but it 'wears off" later in the day. We discussed that she can take it up to 3-4 times a day for symptom relief. 10 point review of system is negative. She has occassional rectal soreness from her diarrhea.  She denies any vulvar lesions or pruritis. She denies any bleeding.  Her goal weight is 160-180#.   Current Meds:  Outpatient Encounter Prescriptions as of 12/03/2011  Medication Sig Dispense Refill  . amphetamine-dextroamphetamine (ADDERALL) 10 MG tablet Take 1 tablet (10 mg total) by mouth 3 (three) times daily.  90 tablet  0  . Cyclobenzaprine HCl (FLEXERIL PO) Take 10 mg by mouth every evening.       . cyclopentolate (CYCLODRYL,CYCLOGYL) 1 % ophthalmic solution Place 1 drop into the right eye once.      . diclofenac (VOLTAREN) 75 MG EC tablet Take 75 mg by mouth 2 (two) times daily. PT TO STOP ON 02-26-11      . Glucose Blood (TRUETEST TEST VI) by In Vitro route 2 (two) times daily.        Marland Kitchen HYDROcodone-acetaminophen (NORCO) 7.5-325 MG per tablet TAKE 1 TABLET BY MOUTH EVERY 4-6 HOURS AS NEEDED FOR PAIN  60 tablet  0  . levothyroxine (SYNTHROID, LEVOTHROID) 175 MCG tablet  Take 2 tablets (350 mcg total) by mouth daily.  60 tablet  5  . NEXIUM 40 MG capsule TAKE 1 CAPSULE BY MOUTH ONCE DAILY  90 capsule  1  . prednisoLONE acetate (PRED FORTE) 1 % ophthalmic suspension Place 1 drop into the left eye 4 (four) times daily.       Marland Kitchen triamcinolone cream (KENALOG) 0.1 % Apply topically 2 (two) times daily.  45 g  0  . TRUEPLUS LANCETS 28G MISC by Does not apply route 2 (two) times daily.       . valACYclovir (VALTREX) 500 MG tablet Take 500 mg by mouth 2 (two) times daily.       Marland Kitchen DISCONTD: sertraline (ZOLOFT) 25 MG tablet 1 tab ever other day x 2 weeks, then stop  30 tablet  5    Allergy:  Allergies  Allergen Reactions  . Sulfonamide Derivatives Hives     Social Hx:   History   Social History  . Marital Status: Widowed    Spouse Name: N/A    Number of Children: N/A  . Years of Education: N/A   Occupational History  . Not on file.   Social History Main Topics  . Smoking status: Current Everyday Smoker -- 0.3 packs/day for 25 years    Types: Cigarettes  . Smokeless tobacco: Never Used   Comment: cutting down  . Alcohol Use: No     QUIT 1997  . Drug Use: No     QUIT 1997- COCAINE, MARIJUANA, METHAPHENMINES  . Sexually Active: No   Other Topics Concern  . Not on file   Social History Narrative  . No narrative on file    Past Surgical Hx:  Past Surgical History  Procedure Date  . Cholecystectomy 2005  . Abdominal hysterectomy 2009  . Bilateral oophorectomy 2009  . Ventral hernia repair 05-30-10  . Dilation and curettage of uterus MARCH 85  . Cervical polypectomy 1987  . Ectopic pregnancy surgery 1998  . Wide local excision labrial lesion 06-13-09  . Multiple i & d's for panniculitis X4-- LAST ONE 10-25-2007  . Tonsillectomy and adenoidectomy 1985  . Vulvar lesion removal 03/03/2011    Procedure: VULVAR LESION;  Surgeon: Rejeana Brock A. Duard Brady, MD;  Location: [facility];  Service: Gynecology;  Laterality: N/A;  . Eye injection     Past Medical Hx:  Past Medical History  Diagnosis Date  . OBESITY, MORBID   . ADHD   . Generalized anxiety disorder   . HYPERTENSION   . DEPRESSION   . Hypothyroidism   . 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 Obstruction of parotid duct REMOTE HX OF -- PER MRI    ASYMPTOMATIC  . GERD (gastroesophageal reflux disease)     CONTROLLED W/ NEXIUM  . Chronic iritis, left eye DX 2007    CURRENT TX SINCE MAY 2012 FOLLOW-- [facility]  . Hepatitis C antibody test positive DX 1991-- POSITIVE ANTI-BODY    no treatment, declined INF  . History of substance abuse QUIT 1997    IVDA  . Diabetes mellitus     DIET CONTROLLED  AND EXERCISE  . 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 Diabetes Mother   . Hypothyroidism Mother   . Lung cancer Mother   .  Lung cancer Sister     Vitals:  Blood pressure 102/70, pulse 82, temperature 98.4 F (36.9 C), temperature source Oral, resp. rate 18, height 5\' 7"  (1.702 m), weight 268 lb 12.8 oz (121.927 kg).  Physical Exam: Well-nourished well-developed female in no acute distress.   Neck: Supple, no lymphadenopathy no thyromegaly.  Lungs: Clear to auscultation bilaterally.   Cardiovascular: Regular rate and rhythm.  Abdomen: Well-healed surgical incisions. Abdomen is obese. There is a significant amount of excess skin. There is no masses. Exam is limited by habitus.  Groins: No lymphadenopathy.  Extremities: No edema.  Pelvic: Normal external female genitalia. There are no new lesions. No masses or nodularity. Rectal confirms.  Assessment/Plan:  Ms. Frankland is a very pleasant 46 year old with stage IA ovarian carcinoma diagnosed and treated in May of 2009 was no evidence of recurrent disease and she has also been treated for vulvar dysplasia and has no evidence of recurrence. She was given names and contact information for gynecologic oncologists in [place] and she will have them contact us so that we can provide records for transfer of care. She was congratulated on her weight loss efforts.    Azka Steger A., MD 12/03/2011, 12:05 PM

## 2011-12-11 ENCOUNTER — Telehealth: Payer: Self-pay | Admitting: Gynecologic Oncology

## 2011-12-11 NOTE — Telephone Encounter (Signed)
Message left with CA 125 results: 11.5.  Instructed to call for any questions or concerns.

## 2011-12-14 ENCOUNTER — Other Ambulatory Visit: Payer: Self-pay

## 2011-12-14 MED ORDER — AMPHETAMINE-DEXTROAMPHETAMINE 10 MG PO TABS: 10.0000 mg | ORAL_TABLET | Freq: Three times a day (TID) | ORAL | Status: DC

## 2011-12-14 MED ORDER — DIPHENOXYLATE-ATROPINE 2.5-0.025 MG PO TABS: 1.0000 | ORAL_TABLET | Freq: Four times a day (QID) | ORAL | Status: AC | PRN

## 2011-12-14 MED ORDER — DIPHENOXYLATE-ATROPINE 2.5-0.025 MG PO TABS: 1.0000 | ORAL_TABLET | Freq: Four times a day (QID) | ORAL | Status: DC | PRN

## 2011-12-14 NOTE — Telephone Encounter (Signed)
Ok as requested - letter and med refills

## 2011-12-14 NOTE — Telephone Encounter (Signed)
Pt informed, Rx and letter in cabinet for pt pick up

## 2011-12-14 NOTE — Telephone Encounter (Signed)
Pt called requesting a letter to submit to disability stating she is capable of managing her own bills and bank account. Pt is also requesting refills of Adderall and Lomotil.

## 2011-12-16 ENCOUNTER — Other Ambulatory Visit: Payer: Self-pay

## 2011-12-16 MED ORDER — HYDROCODONE-ACETAMINOPHEN 7.5-325 MG PO TABS: 1.0000 | ORAL_TABLET | Freq: Four times a day (QID) | ORAL | Status: DC | PRN

## 2011-12-16 NOTE — Telephone Encounter (Signed)
Rx faxed to pharmacy, pt informed.  

## 2011-12-25 ENCOUNTER — Other Ambulatory Visit: Payer: Self-pay

## 2011-12-25 MED ORDER — ESOMEPRAZOLE MAGNESIUM 40 MG PO CPDR: 40.0000 mg | DELAYED_RELEASE_CAPSULE | Freq: Every day | ORAL | Status: AC

## 2011-12-29 ENCOUNTER — Telehealth: Payer: Self-pay

## 2011-12-29 DIAGNOSIS — E039 Hypothyroidism, unspecified: Secondary | ICD-10-CM

## 2011-12-29 NOTE — Telephone Encounter (Signed)
Pt advised via personal VM 

## 2011-12-29 NOTE — Telephone Encounter (Signed)
Ok to recheck TSH as requested - order entered  Unfortunately, I do not know anyone in [place] - but when pt does find new provider/appt, let us know and we will send our records -   thanks

## 2011-12-29 NOTE — Telephone Encounter (Signed)
Pt called requesting TSH recheck because she still believes that her doing is too high. Pt will also be moving out of town and is requesting MD recommendation/referral to a MD in [place].

## 2012-02-11 ENCOUNTER — Other Ambulatory Visit: Payer: Self-pay | Admitting: *Deleted

## 2012-02-11 MED ORDER — AMPHETAMINE-DEXTROAMPHETAMINE 10 MG PO TABS: 10.0000 mg | ORAL_TABLET | Freq: Three times a day (TID) | ORAL | Status: DC

## 2012-02-11 MED ORDER — HYDROCODONE-ACETAMINOPHEN 7.5-325 MG PO TABS: 1.0000 | ORAL_TABLET | Freq: Four times a day (QID) | ORAL | Status: DC | PRN

## 2012-02-11 NOTE — Telephone Encounter (Signed)
ok 

## 2012-02-11 NOTE — Telephone Encounter (Signed)
Left msg on vm needing refill on her Adderal & hydrocodone...Raechel Chute

## 2012-02-12 NOTE — Telephone Encounter (Signed)
Notified pt rx ready for pick-up.../lmb 

## 2012-02-21 IMAGING — CR DG CHEST 2V
2 series · 2 of 2 positions shown · non-contrast
Comparison: 06/12/2009

CLINICAL DATA: Preop.  Hypertension, smoker.

CHEST - 2 VIEW

[w chest pa]
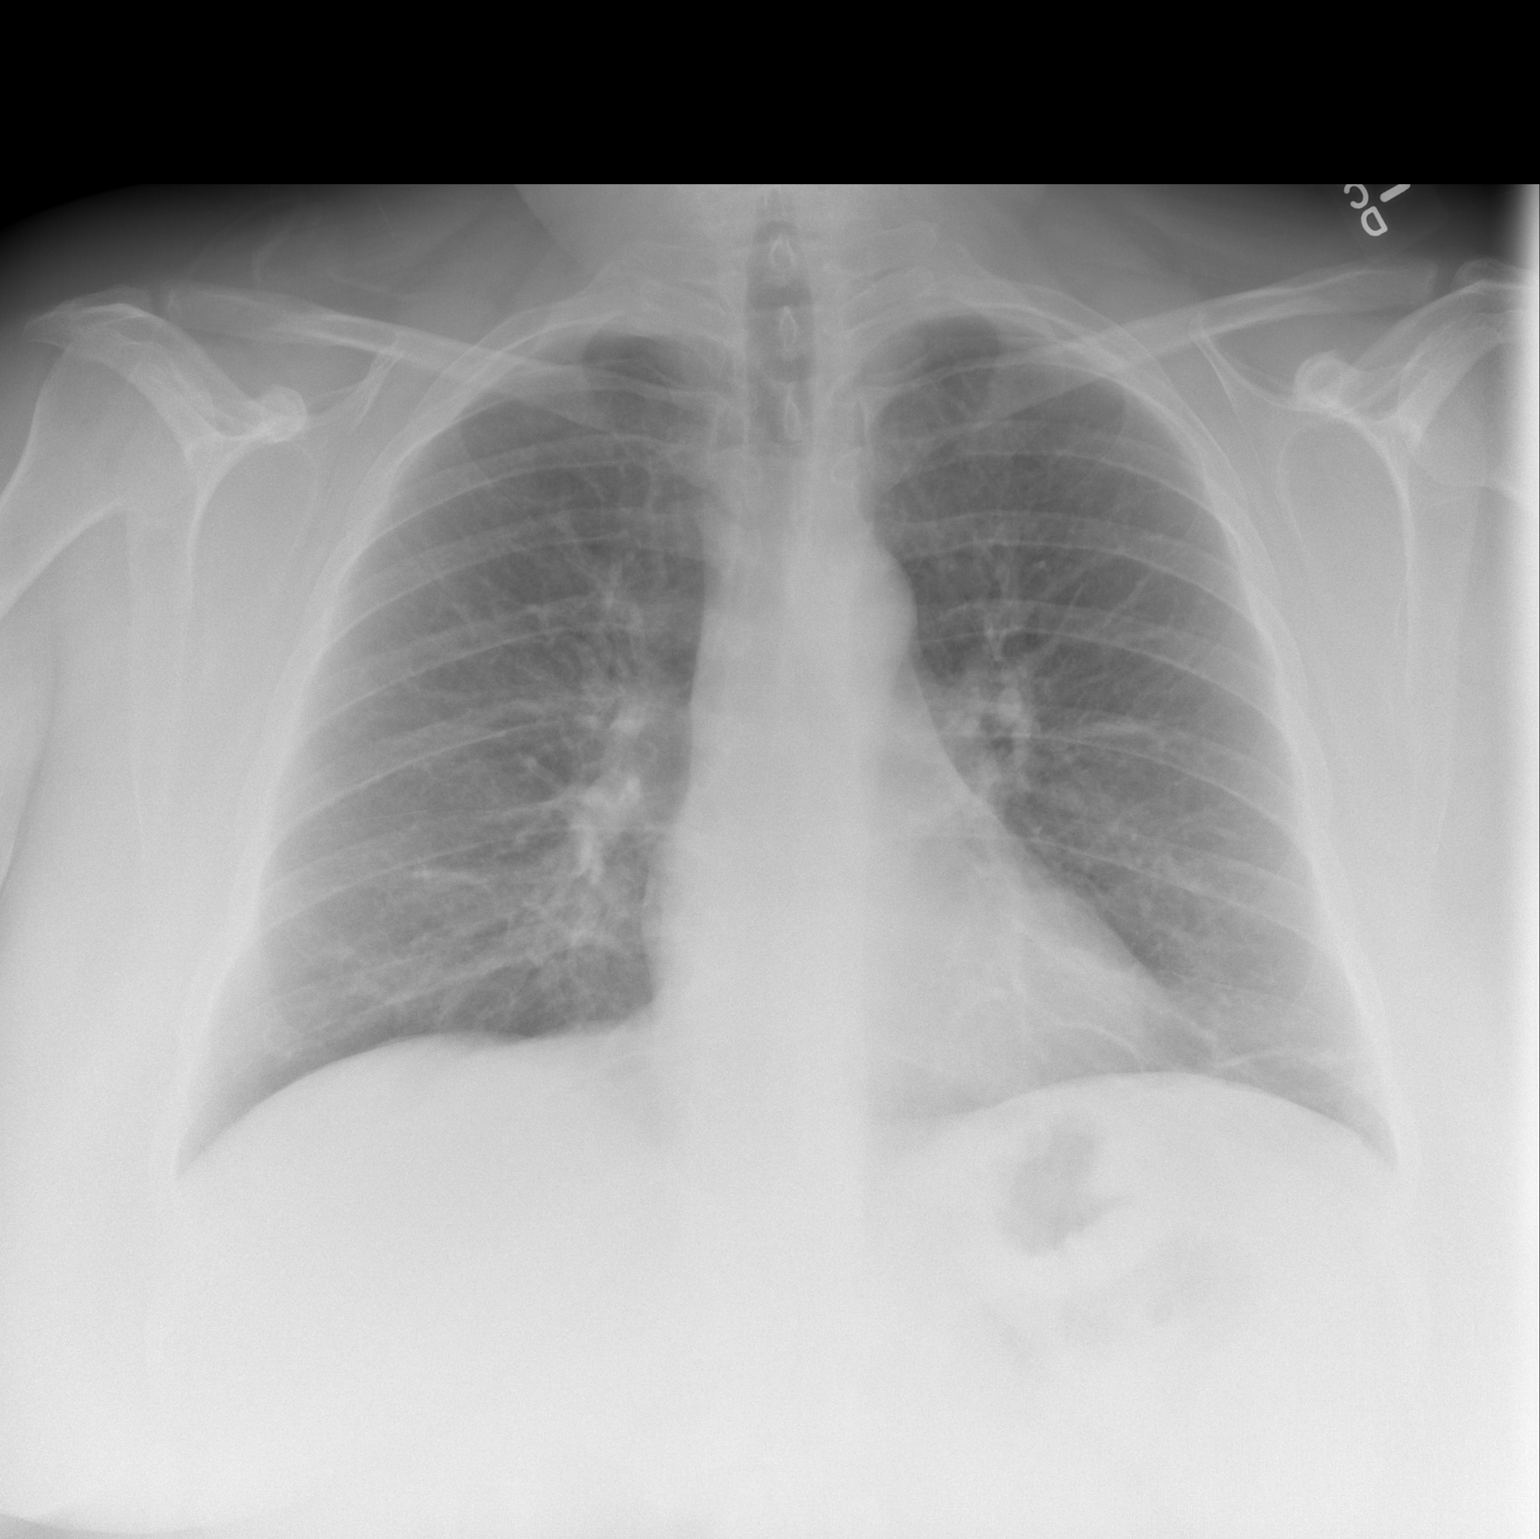

[w chest lat]
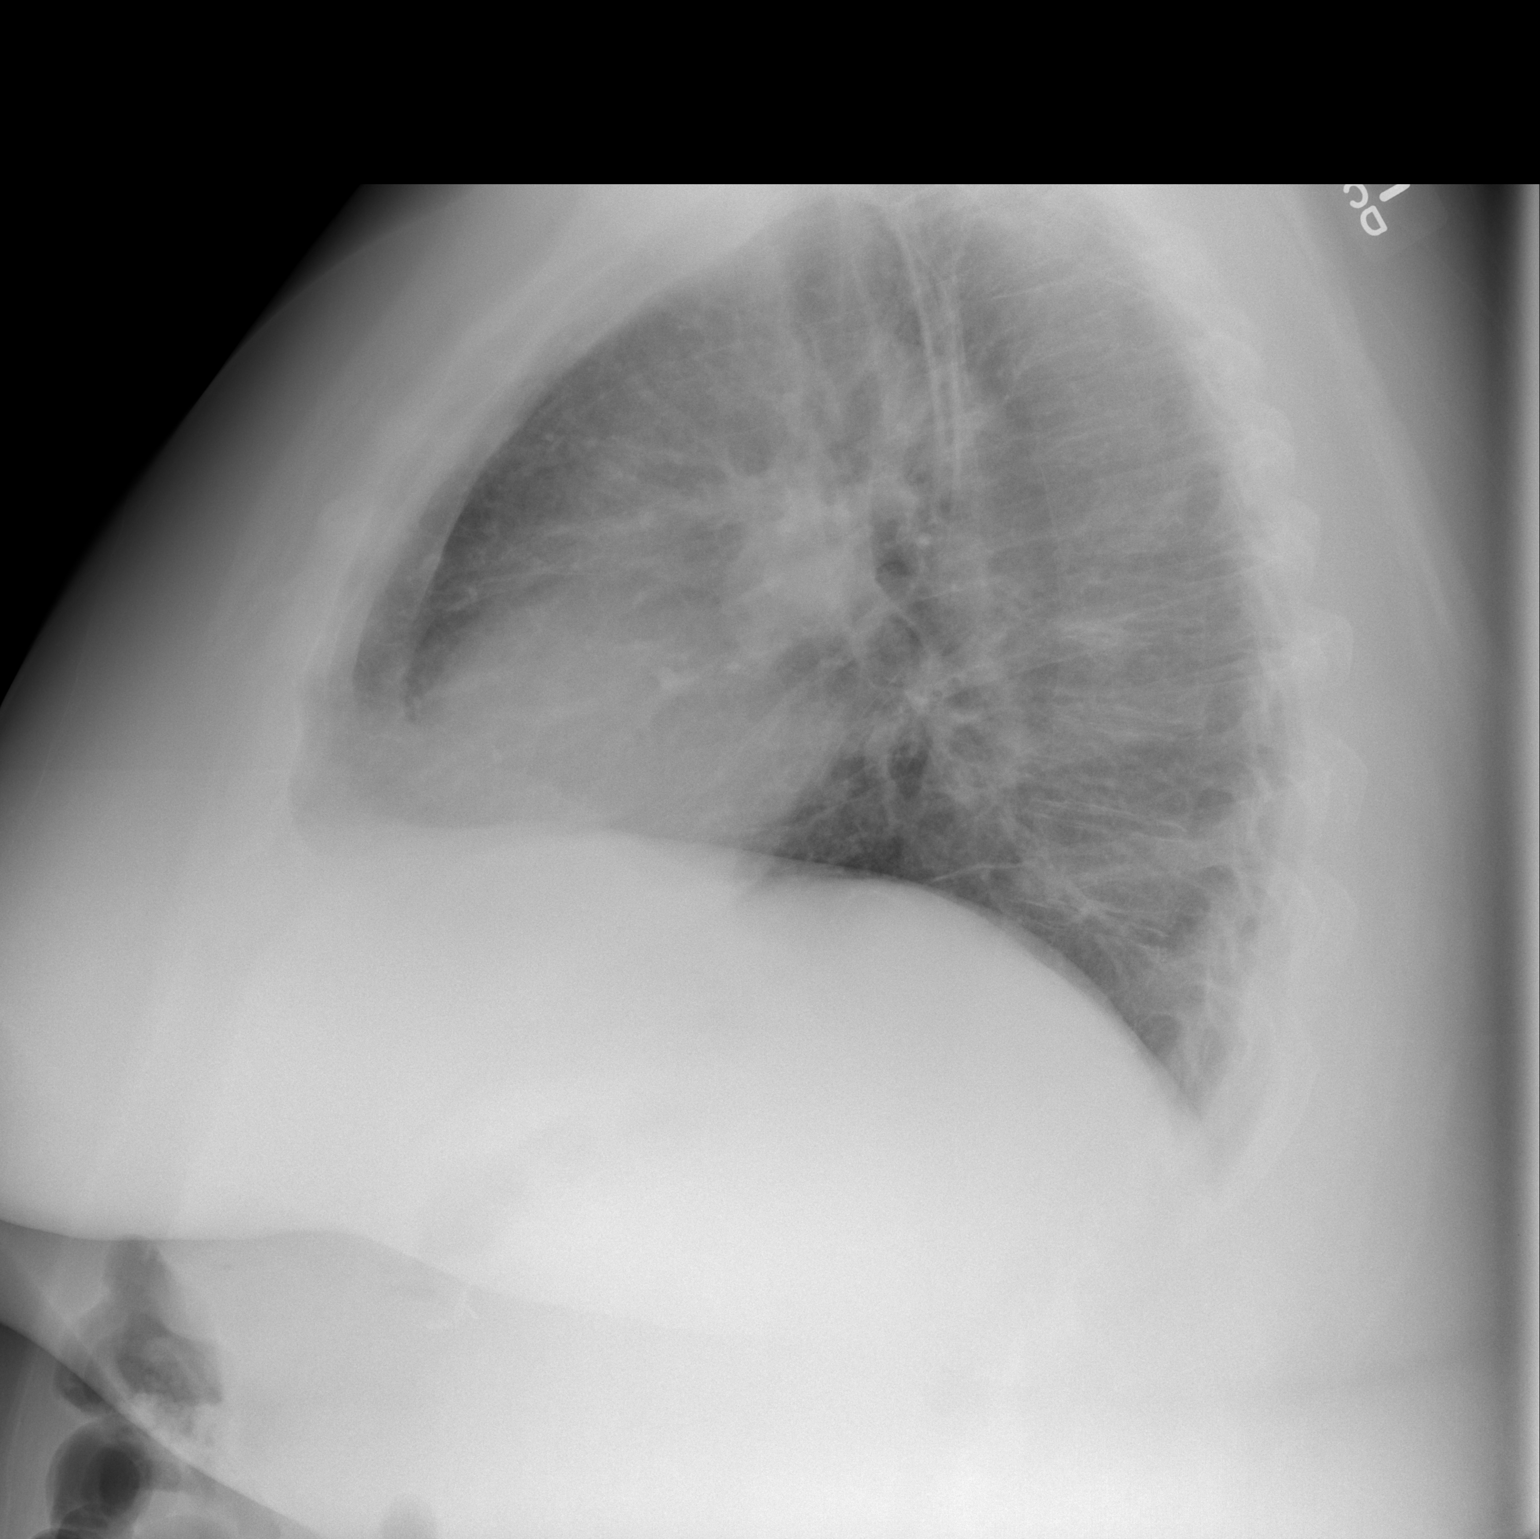

[2 of 2 positions shown; findings below may reference images not displayed]

FINDINGS: Linear atelectasis or scarring in the left base.  Right
lung is clear.  Heart is normal size.  No effusions or acute bony
abnormality.
IMPRESSION: Left basilar atelectasis or scarring.

## 2012-03-22 ENCOUNTER — Other Ambulatory Visit: Payer: Self-pay | Admitting: *Deleted

## 2012-03-22 MED ORDER — AMPHETAMINE-DEXTROAMPHETAMINE 10 MG PO TABS: 10.0000 mg | ORAL_TABLET | Freq: Three times a day (TID) | ORAL | Status: DC

## 2012-03-22 MED ORDER — HYDROCODONE-ACETAMINOPHEN 7.5-325 MG PO TABS: 1.0000 | ORAL_TABLET | Freq: Four times a day (QID) | ORAL | Status: AC | PRN

## 2012-03-22 NOTE — Telephone Encounter (Signed)
Notified pt rx ready for pick-up.../lmb 

## 2012-03-22 NOTE — Telephone Encounter (Signed)
Left msg on vm requesting refill on adderral & hydrocodone...Raechel Chute

## 2012-04-20 ENCOUNTER — Telehealth: Payer: Self-pay | Admitting: *Deleted

## 2012-04-20 DIAGNOSIS — I10 Essential (primary) hypertension: Secondary | ICD-10-CM

## 2012-04-20 DIAGNOSIS — E039 Hypothyroidism, unspecified: Secondary | ICD-10-CM

## 2012-04-20 DIAGNOSIS — E119 Type 2 diabetes mellitus without complications: Secondary | ICD-10-CM

## 2012-04-20 DIAGNOSIS — Z0000 Encounter for general adult medical examination without abnormal findings: Secondary | ICD-10-CM

## 2012-04-20 NOTE — Telephone Encounter (Signed)
Received staff msg need cpx entered for July.[name]

## 2012-04-20 NOTE — Telephone Encounter (Signed)
Message copied by Deatra James on Wed Apr 20, 2012 12:01 PM ------      Message from: Etheleen Sia      Created: Wed Apr 20, 2012 11:59 AM      Regarding: LABS       PHYSICAL LABS IN [place]

## 2012-04-21 ENCOUNTER — Ambulatory Visit: Payer: Medicare Other | Admitting: Internal Medicine

## 2012-05-03 ENCOUNTER — Other Ambulatory Visit: Payer: Self-pay | Admitting: Internal Medicine

## 2012-05-03 MED ORDER — AMPHETAMINE-DEXTROAMPHETAMINE 10 MG PO TABS: 10.0000 mg | ORAL_TABLET | Freq: Three times a day (TID) | ORAL | Status: DC

## 2012-05-03 NOTE — Telephone Encounter (Signed)
The patient called the triage line and is hoping to get a refill on her Adderall rx

## 2012-05-04 NOTE — Telephone Encounter (Signed)
Notified pt rx ready for pick-up.../lmb 

## 2012-06-09 ENCOUNTER — Other Ambulatory Visit: Payer: Self-pay

## 2012-06-09 MED ORDER — AMPHETAMINE-DEXTROAMPHETAMINE 10 MG PO TABS: 10.0000 mg | ORAL_TABLET | Freq: Three times a day (TID) | ORAL | Status: AC

## 2012-06-09 NOTE — Telephone Encounter (Signed)
Pt informed, Rx in cabinet for pt pick up  

## 2012-08-08 ENCOUNTER — Other Ambulatory Visit: Payer: Self-pay | Admitting: Oncology

## 2012-08-09 ENCOUNTER — Other Ambulatory Visit: Payer: Self-pay

## 2012-08-09 ENCOUNTER — Ambulatory Visit: Payer: Medicare Other | Admitting: Oncology

## 2012-08-09 ENCOUNTER — Other Ambulatory Visit: Payer: Medicare Other | Admitting: Lab

## 2012-08-10 ENCOUNTER — Telehealth: Payer: Self-pay | Admitting: Oncology

## 2012-09-05 ENCOUNTER — Encounter: Payer: Medicare Other | Admitting: Oncology

## 2012-09-05 ENCOUNTER — Other Ambulatory Visit: Payer: Medicare Other | Admitting: Lab

## 2012-09-05 ENCOUNTER — Telehealth: Payer: Self-pay | Admitting: *Deleted

## 2012-09-05 NOTE — Telephone Encounter (Signed)
Left patient a message regarding appointment that was scheduled for today with Dr Darrold Span. Asked her to call us back.

## 2012-09-05 NOTE — Telephone Encounter (Signed)
Pt left voice mail that she has moved and is transferring to [facility]. Returned call to patient to let us know phone and fax number so we can send appropriate information, if not already completed

## 2012-09-07 ENCOUNTER — Telehealth: Payer: Self-pay | Admitting: *Deleted

## 2012-09-07 NOTE — Telephone Encounter (Signed)
Patient left a voice mail that she would like her records sent to Dr Frankey Poot, phone: [phone]; fax: [phone]. She has relocated to [place] [facility] and will receive treatment there

## 2012-09-08 ENCOUNTER — Telehealth: Payer: Self-pay | Admitting: Oncology

## 2012-09-08 NOTE — Telephone Encounter (Signed)
Faxed pt medical records to [facility]

## 2012-10-12 ENCOUNTER — Encounter: Payer: Medicare Other | Admitting: Internal Medicine

## 2013-01-02 NOTE — Progress Notes (Signed)
This encounter was created in error - please disregard.

## 2016-01-02 ENCOUNTER — Telehealth: Payer: Self-pay | Admitting: Oncology

## 2016-01-02 NOTE — Telephone Encounter (Signed)
Faxed pt records to [facility] [phone]

## 2018-09-05 DEATH — deceased
